# Patient Record
Sex: Female | Born: 1965
Health system: Southern US, Community
[De-identification: ages and names within clinical notes are randomized; demographics above are authoritative.]

## PROBLEM LIST (undated history)

## (undated) DIAGNOSIS — M779 Enthesopathy, unspecified: Secondary | ICD-10-CM

## (undated) DIAGNOSIS — R079 Chest pain, unspecified: Secondary | ICD-10-CM

## (undated) DIAGNOSIS — M1990 Unspecified osteoarthritis, unspecified site: Secondary | ICD-10-CM

## (undated) DIAGNOSIS — F329 Major depressive disorder, single episode, unspecified: Secondary | ICD-10-CM

## (undated) DIAGNOSIS — I10 Essential (primary) hypertension: Secondary | ICD-10-CM

## (undated) DIAGNOSIS — N83209 Unspecified ovarian cyst, unspecified side: Secondary | ICD-10-CM

## (undated) DIAGNOSIS — T7840XA Allergy, unspecified, initial encounter: Secondary | ICD-10-CM

## (undated) DIAGNOSIS — E785 Hyperlipidemia, unspecified: Secondary | ICD-10-CM

## (undated) DIAGNOSIS — E119 Type 2 diabetes mellitus without complications: Secondary | ICD-10-CM

## (undated) DIAGNOSIS — L600 Ingrowing nail: Secondary | ICD-10-CM

## (undated) DIAGNOSIS — F419 Anxiety disorder, unspecified: Secondary | ICD-10-CM

## (undated) DIAGNOSIS — N809 Endometriosis, unspecified: Secondary | ICD-10-CM

## (undated) DIAGNOSIS — G43909 Migraine, unspecified, not intractable, without status migrainosus: Secondary | ICD-10-CM

## (undated) DIAGNOSIS — K219 Gastro-esophageal reflux disease without esophagitis: Secondary | ICD-10-CM

## (undated) HISTORY — DX: Chest pain, unspecified: R07.9

## (undated) HISTORY — DX: Major depressive disorder, single episode, unspecified: F32.9

## (undated) HISTORY — DX: Unspecified osteoarthritis, unspecified site: M19.90

## (undated) HISTORY — PX: BREAST BIOPSY: SHX20

## (undated) HISTORY — DX: Endometriosis, unspecified: N80.9

## (undated) HISTORY — DX: Unspecified ovarian cyst, unspecified side: N83.209

## (undated) HISTORY — PX: EYE SURGERY: SHX253

## (undated) HISTORY — PX: GALLBLADDER SURGERY: SHX652

## (undated) HISTORY — DX: Essential (primary) hypertension: I10

## (undated) HISTORY — DX: Migraine, unspecified, not intractable, without status migrainosus: G43.909

## (undated) HISTORY — PX: TUBAL LIGATION: SHX77

## (undated) HISTORY — DX: Hyperlipidemia, unspecified: E78.5

## (undated) HISTORY — DX: Allergy, unspecified, initial encounter: T78.40XA

## (undated) HISTORY — DX: Type 2 diabetes mellitus without complications: E11.9

## (undated) HISTORY — DX: Gastro-esophageal reflux disease without esophagitis: K21.9

## (undated) HISTORY — DX: Anxiety disorder, unspecified: F41.9

## (undated) HISTORY — DX: Enthesopathy, unspecified: M77.9

## (undated) HISTORY — DX: Ingrowing nail: L60.0

---

## 2014-09-23 NOTE — Progress Notes (Signed)
Patient ID: Terry Thompson, female   DOB: 09/26/1965, 49 y.o.   MRN: [id number]     Cardiology Office Note   Date:  09/24/2014   ID:  Terry Thompson, DOB 08/19/1965, MRN [id number]  PCP:  [facility]   Cardiologist:   Charlton HawsPeter Jermario Kalmar, MD   No chief complaint on file.     History of Present Illness: Terry Thompson is a 49 y.o. female who presents for evaluation of abnormal ECG  Had routine physical with Terry Thompson on 08/11/14  Apparently had atypical chest pain and evaluated at [facility].  R/O negative troponins ECG with non specific ST changes.  CXR with bronchitis given antibiotics Also had GERD like pain Rx with nexium with ocassional nausea CRF include type 2 DM, HTN and elevated lipids on Rx GB removed in 1990 No recent pain.  She has significant clinical COPD/Bronchitis.  She has not taken any of her meds last 3 days due to finances and was very HTN/Tachycardic on arrival to our office   Lab review 05/07/14  K 4.3  Cr .51 normal LFTls H pylori negative LDL 161 TC 243  A1c 7.8     Past Medical History  Diagnosis Date  . Diabetes mellitus without complication   . GERD (gastroesophageal reflux disease)   . Hyperlipidemia   . Hypertension   . Migraine   . Ingrowing toenail   . Chest pain   . Arthritis     lumbar  . Endometriosis   . Major depressive disorder   . Ovarian cyst   . Tendinitis     wrist    Past Surgical History  Procedure Laterality Date  . Breast biopsy    . Eye surgery    . Gallbladder surgery    . Tubal ligation       Current Outpatient Prescriptions  Medication Sig Dispense Refill  . butalbital-acetaminophen-caffeine (FIORICET WITH CODEINE) 50-325-40-30 MG per capsule Take 1 capsule by mouth every 4 (four) hours as needed for headache.    . Calcium Carbonate-Vitamin D 600-400 MG-UNIT per tablet Take 1 tablet by mouth daily.    Marland Kitchen. esomeprazole (NEXIUM) 40 MG packet Take 40 mg by mouth daily before breakfast.    . gabapentin  (NEURONTIN) 300 MG capsule Take 300 mg by mouth 3 (three) times daily.    . hydrochlorothiazide (HYDRODIURIL) 25 MG tablet Take 25 mg by mouth daily.    Marland Kitchen. lisinopril (PRINIVIL,ZESTRIL) 10 MG tablet Take 10 mg by mouth daily.    . metFORMIN (GLUCOPHAGE) 500 MG tablet Take 500 mg by mouth 2 (two) times daily with a meal.    . pravastatin (PRAVACHOL) 80 MG tablet Take 80 mg by mouth daily.    . propranolol (INDERAL) 40 MG tablet Take 40 mg by mouth 3 (three) times daily.    . traMADol (ULTRAM) 50 MG tablet Take 50 mg by mouth every 6 (six) hours as needed.     No current facility-administered medications for this visit.    Allergies:   Naproxen    Social History:  The patient  reports that she has been smoking.  She does not have any smokeless tobacco history on file.   Family History:  The patient's family history includes Diabetes in her brother; Heart disease in her brother and father.    ROS:  Please see the history of present illness.   Otherwise, review of systems are positive for none.   All other systems are reviewed and negative.  PHYSICAL EXAM: VS:  BP 180/120 mmHg  Pulse 115  Ht  (1.778 m)  Wt 213 lb (96.616 kg)  BMI 30.56 kg/m2 , BMI Body mass index is 30.56 kg/(m^2). Chronically ill white female  HEENT: strabismus with lazy right eye  Neck: no JVD, carotid bruits, or masses Cardiac:  RRR; no murmurs, rubs, or gallops,no edema  Respiratory:  Diffuse bronchitic changes and rhonchi with exp wheezing  GI: soft, nontender, nondistended, + BS MS: no deformity or atrophy Skin: warm and dry, no rash Neuro:  Strength and sensation are intact Psych: euthymic mood, full affect   EKG:   SR rate 77  T wave inversions in I, and AVL  05/29/14    09/24/14  SR rate 115  T wave inversions I,AVL  Tachy rate 115 not taking her inderal last 3 days    Recent Labs: No results found for requested labs within last 365 days.    Lipid Panel No results found for: CHOL, TRIG, HDL,  CHOLHDL, VLDL, LDLCALC, LDLDIRECT    Wt Readings from Last 3 Encounters:  09/24/14 213 lb (96.616 kg)      Other studies Reviewed: Additional studies/ records that were reviewed today include: Primary Care records Terry Thompson see HPI details .    ASSESSMENT AND PLAN:  1.   Abnormal ECG  Repeat in our office with persistant lateral T wave changes. Will need stress testing at some point but need to get back on meds for tachycardia and HTN first.   Will order echo to assess LVH, EF and RWMA 2. HTN  Warned her about non compliance Given dose of bystolic and tribenzor in office Called in generic scripts to [facility] for meds  F/U with me next available 3. DM  Get back on glucophage fu primary refilled script 4. COPD discussed smoking cessation Recent CXR with bronchitis  Would benefit from inhalers  F/U primary    Current medicines are reviewed at length with the patient today.  The patient does not have concerns regarding medicines.  The following changes have been made:  New scripts [facility] for HTN meds   Labs/ tests ordered today include:  Echo  No orders of the defined types were placed in this encounter.     Disposition:   FU with  Next available    i    Signed, Charlton Haws, MD  09/24/2014 11:15 AM    [facility] [address] [place], [place], [place]  [address] Phone: [phone]; Fax: [phone]

## 2014-09-24 ENCOUNTER — Encounter: Payer: Self-pay | Admitting: Cardiovascular Disease

## 2014-09-24 ENCOUNTER — Ambulatory Visit (INDEPENDENT_AMBULATORY_CARE_PROVIDER_SITE_OTHER): Payer: PRIVATE HEALTH INSURANCE | Admitting: Cardiovascular Disease

## 2014-09-24 VITALS — BP 180/120 | HR 115 | Ht 70.0 in | Wt 213.0 lb

## 2014-09-24 DIAGNOSIS — R0789 Other chest pain: Secondary | ICD-10-CM

## 2014-09-24 MED ORDER — PRAVASTATIN SODIUM 80 MG PO TABS: 80.0000 mg | ORAL_TABLET | Freq: Every day | ORAL | Status: DC

## 2014-09-24 MED ORDER — METOPROLOL TARTRATE 50 MG PO TABS: 50.0000 mg | ORAL_TABLET | Freq: Two times a day (BID) | ORAL | Status: DC

## 2014-09-24 MED ORDER — LISINOPRIL 40 MG PO TABS: 40.0000 mg | ORAL_TABLET | Freq: Every day | ORAL | Status: DC

## 2014-09-24 MED ORDER — HYDROCHLOROTHIAZIDE 25 MG PO TABS: 25.0000 mg | ORAL_TABLET | Freq: Every day | ORAL | Status: DC

## 2014-09-24 MED ORDER — METFORMIN HCL 500 MG PO TABS: 500.0000 mg | ORAL_TABLET | Freq: Two times a day (BID) | ORAL | Status: DC

## 2014-09-24 NOTE — Patient Instructions (Signed)
Your physician recommends that you schedule a follow-up appointment in: 3 WEEKS [facility]  [facility]  Your physician has recommended you make the following change in your medication:  STOP PROPANOLOL START METOPROLOL  50 MG  TWICE  DAILY   Your physician has requested that you have an echocardiogram. Echocardiography is a painless test that uses sound waves to create images of your heart. It provides your doctor with information about the size and shape of your heart and how well your heart's chambers and valves are working. This procedure takes approximately one hour. There are no restrictions for this procedure.

## 2014-10-01 ENCOUNTER — Ambulatory Visit (HOSPITAL_COMMUNITY): Payer: PRIVATE HEALTH INSURANCE | Attending: Cardiovascular Disease | Admitting: Radiology

## 2014-10-01 DIAGNOSIS — R079 Chest pain, unspecified: Secondary | ICD-10-CM

## 2014-10-01 DIAGNOSIS — R0789 Other chest pain: Secondary | ICD-10-CM

## 2014-10-01 NOTE — Progress Notes (Signed)
Echocardiogram performed.  

## 2014-10-26 NOTE — Progress Notes (Signed)
Patient ID: Terry NakayamaKaren Chelf, female   DOB: 06/17/1966, 49 y.o.   MRN: [id number]     Cardiology Office Note   Date:  10/26/2014   ID:  Terry Thompson, DOB 04/28/1966, MRN [id number]  PCP:  [facility]   Cardiologist:   Charlton HawsPeter Nishan, MD   No chief complaint on file.     History of Present Illness: Terry NakayamaKaren Sukup is a 49 y.o. female who presents for evaluation of abnormal ECG  Had routine physical with Amao on 08/11/14  Apparently had atypical chest pain and evaluated at [facility].  R/O negative troponins ECG with non specific ST changes.  CXR with bronchitis given antibiotics Also had GERD like pain Rx with nexium with ocassional nausea CRF include type 2 DM, HTN and elevated lipids on Rx GB removed in 1990 No recent pain.  She has significant clinical COPD/Bronchitis.  She has not taken any of her meds last 3 days due to finances and was very HTN/Tachycardic on arrival to our office   Restarted on meds and compliance stressed now f/u   Lab review 05/07/14  K 4.3  Cr .51 normal LFTls H pylori negative LDL 161 TC 243  A1c 7.8   Echo done 4/14 /16 normal Study Conclusions  - Left ventricle: The cavity size was normal. Systolic function was normal. The estimated ejection fraction was in the range of 55% to 60%. Wall motion was normal; there were no regional wall motion abnormalities. Left ventricular diastolic function parameters were normal. - Mitral valve: Calcified annulus. Mildly thickened leaflets . - Pulmonary arteries: Systolic pressure was mildly increased. PA peak pressure: 31 mm Hg (S).  Was hospitalized for chest pain in [place] end of April  R/O no stress testing or cath done ? GI in nature   Past Medical History  Diagnosis Date  . Diabetes mellitus without complication   . GERD (gastroesophageal reflux disease)   . Hyperlipidemia   . Hypertension   . Migraine   . Ingrowing toenail   . Chest pain   . Arthritis     lumbar  .  Endometriosis   . Major depressive disorder   . Ovarian cyst   . Tendinitis     wrist    Past Surgical History  Procedure Laterality Date  . Breast biopsy    . Eye surgery    . Gallbladder surgery    . Tubal ligation       Current Outpatient Prescriptions  Medication Sig Dispense Refill  . butalbital-acetaminophen-caffeine (FIORICET WITH CODEINE) 50-325-40-30 MG per capsule Take 1 capsule by mouth every 4 (four) hours as needed for headache.    . Calcium Carbonate-Vitamin D 600-400 MG-UNIT per tablet Take 1 tablet by mouth daily.    Marland Kitchen. esomeprazole (NEXIUM) 40 MG packet Take 40 mg by mouth daily before breakfast.    . gabapentin (NEURONTIN) 300 MG capsule Take 300 mg by mouth 3 (three) times daily.    . hydrochlorothiazide (HYDRODIURIL) 25 MG tablet Take 1 tablet (25 mg total) by mouth daily. 30 tablet 11  . lisinopril (PRINIVIL,ZESTRIL) 40 MG tablet Take 1 tablet (40 mg total) by mouth daily. 30 tablet 11  . metFORMIN (GLUCOPHAGE) 500 MG tablet Take 1 tablet (500 mg total) by mouth 2 (two) times daily with a meal. 60 tablet 11  . metoprolol (LOPRESSOR) 50 MG tablet Take 1 tablet (50 mg total) by mouth 2 (two) times daily. 60 tablet 11  . pravastatin (PRAVACHOL) 80 MG  tablet Take 1 tablet (80 mg total) by mouth daily. 30 tablet 11  . traMADol (ULTRAM) 50 MG tablet Take 50 mg by mouth every 6 (six) hours as needed.     No current facility-administered medications for this visit.    Allergies:   Naproxen    Social History:  The patient  reports that she has been smoking.  She does not have any smokeless tobacco history on file.   Family History:  The patient's family history includes Diabetes in her brother; Heart disease in her brother and father.    ROS:  Please see the history of present illness.   Otherwise, review of systems are positive for none.   All other systems are reviewed and negative.    PHYSICAL EXAM: VS:  There were no vitals taken for this visit. , BMI There  is no weight on file to calculate BMI. Chronically ill white female  HEENT: strabismus with lazy right eye  Neck: no JVD, carotid bruits, or masses Cardiac:  RRR; no murmurs, rubs, or gallops,no edema  Respiratory:  Diffuse bronchitic changes and rhonchi with exp wheezing  GI: soft, nontender, nondistended, + BS MS: no deformity or atrophy Skin: warm and dry, no rash Neuro:  Strength and sensation are intact Psych: euthymic mood, full affect   EKG:   SR rate 77  T wave inversions in I, and AVL  05/29/14    09/24/14  SR rate 115  T wave inversions I,AVL  Tachy rate 115 not taking her inderal last 3 days    Recent Labs: No results found for requested labs within last 365 days.    Lipid Panel No results found for: CHOL, TRIG, HDL, CHOLHDL, VLDL, LDLCALC, LDLDIRECT    Wt Readings from Last 3 Encounters:  09/24/14 213 lb (96.616 kg)      Other studies Reviewed: Additional studies/ records that were reviewed today include: Primary Care records Amao see HPI details .    ASSESSMENT AND PLAN:  1.   Chest Pain:  Discussed cath vs myovue She prefers starting with myovue.  Unable to walk on treadmill and baseline abnormal ECG  Lexiscan Myovue ordered  2. HTN  Improved compliant with generic meds now  3. DM  Get back on glucophage fu primary refilled script 4. COPD discussed smoking cessation Recent CXR with bronchitis  Would benefit from inhalers  F/U primary  She has cut back to less than 1/2 ppd since last visit   Current medicines are reviewed at length with the patient today.  The patient does not have concerns regarding medicines.  The following changes have been made:  None   Labs/ tests ordered today include:  Lexiscan myovue   No orders of the defined types were placed in this encounter.     Disposition:   FU with  Me 3 months if myovue normal     Signed, Charlton HawsPeter Nishan, MD  10/26/2014 8:38 AM    [facility] [address], [place], [place]   [address] Phone: [phone]; Fax: [phone]

## 2014-10-28 ENCOUNTER — Ambulatory Visit (INDEPENDENT_AMBULATORY_CARE_PROVIDER_SITE_OTHER): Payer: PRIVATE HEALTH INSURANCE | Admitting: Cardiovascular Disease

## 2014-10-28 ENCOUNTER — Encounter: Payer: Self-pay | Admitting: Cardiovascular Disease

## 2014-10-28 VITALS — BP 130/70 | HR 80 | Ht 70.0 in | Wt 209.8 lb

## 2014-10-28 DIAGNOSIS — R0789 Other chest pain: Secondary | ICD-10-CM

## 2014-10-28 NOTE — Patient Instructions (Signed)
Medication Instructions:  NO CHANGES  Labwork: NONE  Testing/Procedures: Your physician has requested that you have a lexiscan myoview. For further information please visit [email or web address]. Please follow instruction sheet, as given.   Follow-Up: Your physician recommends that you schedule a follow-up appointment in:  3 MONTHS  WITH DR Eden EmmsNISHAN  Any Other Special Instructions Will Be Listed Below (If Applicable). \

## 2014-11-09 ENCOUNTER — Telehealth (HOSPITAL_COMMUNITY): Payer: Self-pay

## 2014-11-09 NOTE — Telephone Encounter (Signed)
Patient given detailed instructions per Myocardial Perfusion Study Information Sheet for test on 11-10-2014 at 7:30am. Patient verbalized understanding. Randa EvensEdwards, Chipper Koudelka A

## 2014-11-10 ENCOUNTER — Ambulatory Visit (HOSPITAL_COMMUNITY): Payer: PRIVATE HEALTH INSURANCE | Attending: Cardiovascular Disease

## 2014-11-10 DIAGNOSIS — R9431 Abnormal electrocardiogram [ECG] [EKG]: Secondary | ICD-10-CM | POA: Insufficient documentation

## 2014-11-10 DIAGNOSIS — E119 Type 2 diabetes mellitus without complications: Secondary | ICD-10-CM | POA: Insufficient documentation

## 2014-11-10 DIAGNOSIS — R079 Chest pain, unspecified: Secondary | ICD-10-CM

## 2014-11-10 DIAGNOSIS — I10 Essential (primary) hypertension: Secondary | ICD-10-CM | POA: Insufficient documentation

## 2014-11-10 DIAGNOSIS — R0609 Other forms of dyspnea: Secondary | ICD-10-CM | POA: Insufficient documentation

## 2014-11-10 DIAGNOSIS — R0789 Other chest pain: Secondary | ICD-10-CM

## 2014-11-10 LAB — MYOCARDIAL PERFUSION IMAGING
CHL CUP NUCLEAR SDS: 1
CHL CUP NUCLEAR SRS: 0
CHL CUP RESTING HR STRESS: 80 {beats}/min
CHL CUP STRESS STAGE 1 HR: 82 {beats}/min
CHL CUP STRESS STAGE 1 SBP: 142 mmHg
CHL CUP STRESS STAGE 2 HR: 82 {beats}/min
CHL CUP STRESS STAGE 2 SPEED: 0 mph
CHL CUP STRESS STAGE 3 DBP: 80 mmHg
CHL CUP STRESS STAGE 3 GRADE: 0 %
CHL CUP STRESS STAGE 3 SPEED: 0 mph
CHL CUP STRESS STAGE 4 DBP: 79 mmHg
CHL CUP STRESS STAGE 4 HR: 116 {beats}/min
CHL CUP STRESS STAGE 5 DBP: 84 mmHg
CHL CUP STRESS STAGE 5 HR: 113 {beats}/min
CHL CUP STRESS STAGE 5 SBP: 152 mmHg
CHL CUP STRESS STAGE 6 SBP: 145 mmHg
CHL CUP STRESS STAGE 6 SPEED: 0 mph
CSEPEW: 1 METS
LV dias vol: 70 mL
LVSYSVOL: 23 mL
Nuc Stress EF: 66 %
Peak BP: 157 mmHg
Peak HR: 116 {beats}/min
Percent of predicted max HR: 68 %
RATE: 0.32
SSS: 1
Stage 1 DBP: 77 mmHg
Stage 1 Grade: 0 %
Stage 1 Speed: 0 mph
Stage 2 Grade: 0 %
Stage 3 HR: 99 {beats}/min
Stage 3 SBP: 136 mmHg
Stage 4 Grade: 0 %
Stage 4 SBP: 157 mmHg
Stage 4 Speed: 0 mph
Stage 5 Grade: 0 %
Stage 5 Speed: 0 mph
Stage 6 DBP: 78 mmHg
Stage 6 Grade: 0 %
Stage 6 HR: 99 {beats}/min
TID: 0.93

## 2014-11-10 MED ORDER — REGADENOSON 0.4 MG/5ML IV SOLN
0.4000 mg | Freq: Once | INTRAVENOUS | Status: AC
  Administered 2014-11-10: 0.4 mg via INTRAVENOUS

## 2014-11-10 MED ORDER — TECHNETIUM TC 99M SESTAMIBI GENERIC - CARDIOLITE
11.0000 | Freq: Once | INTRAVENOUS | Status: AC | PRN
  Administered 2014-11-10: 11 via INTRAVENOUS

## 2014-11-10 MED ORDER — TECHNETIUM TC 99M SESTAMIBI GENERIC - CARDIOLITE
33.0000 | Freq: Once | INTRAVENOUS | Status: AC | PRN
  Administered 2014-11-10: 33 via INTRAVENOUS

## 2014-12-03 ENCOUNTER — Other Ambulatory Visit: Payer: Self-pay | Admitting: Family Medicine

## 2014-12-08 ENCOUNTER — Encounter: Payer: Self-pay | Admitting: Cardiovascular Disease

## 2014-12-10 NOTE — Addendum Note (Signed)
Addended by: Drue Dun on: 12/10/2014 03:08 PM   Modules accepted: Orders

## 2015-02-01 ENCOUNTER — Ambulatory Visit: Payer: PRIVATE HEALTH INSURANCE | Admitting: Cardiovascular Disease

## 2015-02-16 ENCOUNTER — Ambulatory Visit: Payer: PRIVATE HEALTH INSURANCE | Admitting: Cardiovascular Disease

## 2015-03-25 ENCOUNTER — Telehealth: Payer: Self-pay | Admitting: Cardiovascular Disease

## 2015-03-25 NOTE — Telephone Encounter (Signed)
Should at least take ACE and beta blocker  If not feeling well go to ER or see primary Amao

## 2015-03-25 NOTE — Telephone Encounter (Signed)
UNABLE TO LEAVE MESSAGE MAIL BOX  NOT SET UP  WILL TRY TOM .Zack Seal

## 2015-03-25 NOTE — Telephone Encounter (Signed)
New message      Pt c/o BP issue: STAT if pt c/o blurred vision, one-sided weakness or slurred speech  1. What are your last 5 BP readings? Yesterday 90-54, today 108-63 2. Are you having any other symptoms (ex. Dizziness, headache, blurred vision, passed out)? Lightheaded, dizzy, blurred vision, neck pain  3. What is your BP issue? bp is too low

## 2015-03-25 NOTE — Telephone Encounter (Signed)
SPOKE WITH  PT   THE  LAST  2 DAYS  HAS  ONLY  BEEN TAKING  METOPROLOL  50 MG  BID  RAN OUT OF   LISINOPRIL  AND  HCTZ  CALLED  WITH  COMPLAINTS OF  LOW  B/P SEE READINGS  BELOW   ENCOURAGED PT TO TAKE  MEDS  AS  INSTRUCTED  AND TO ALSO    CONTINUE TO MONITOR  B/P AND  CALL WITH  READINGS ON   MON  WILL FORWARD TO DR Eden Emms   FOR  REVIEW .Terry Thompson

## 2015-03-26 NOTE — Telephone Encounter (Signed)
PT  NOTIFIED ./CY 

## 2015-03-26 NOTE — Telephone Encounter (Signed)
Follow up ° ° ° ° °Returning a nurses call °

## 2015-04-24 NOTE — Progress Notes (Signed)
Patient ID: Terry Thompson, female   DOB: 07/11/1965, 49 y.o.   MRN: [id number]     Cardiology Office Note   Date:  04/26/2015   ID:  Terry NakayamaKaren Thompson, DOB 08/18/1965, MRN [id number]  PCP:  [facility]   Cardiologist:   Terry HawsPeter Creola Krotz, MD   No chief complaint on file.     History of Present Illness: Terry Thompson is a 49 y.o. female who presents for evaluation of abnormal ECG  Had routine physical with Terry Thompson on 08/11/14  Apparently had atypical chest pain and evaluated at [facility].  R/O negative troponins ECG with non specific ST changes.  CXR with bronchitis given antibiotics Also had GERD like pain Rx with nexium with ocassional nausea CRF include type 2 DM, HTN and elevated lipids on Rx GB removed in 1990 No recent pain.  She has significant clinical COPD/Bronchitis.  She has not taken any of her meds last 3 days due to finances and was very HTN/Tachycardic on arrival to our office   Restarted on meds and compliance stressed now f/u   Lab review 05/07/14  K 4.3  Cr .51 normal LFTls H pylori negative LDL 161 TC 243  A1c 7.8   Echo done 4/14 /16 normal Study Conclusions  - Left ventricle: The cavity size was normal. Systolic function was normal. The estimated ejection fraction was in the range of 55% to 60%. Wall motion was normal; there were no regional wall motion abnormalities. Left ventricular diastolic function parameters were normal. - Mitral valve: Calcified annulus. Mildly thickened leaflets . - Pulmonary arteries: Systolic pressure was mildly increased. PA peak pressure: 31 mm Hg (S).  Was hospitalized for chest pain in [place] end of April  R/O no stress testing or cath done ? GI in nature  F/U myovue here in May was normal with no ischemia   Past Medical History  Diagnosis Date  . Diabetes mellitus without complication (HCC)   . GERD (gastroesophageal reflux disease)   . Hyperlipidemia   . Hypertension   . Migraine   . Ingrowing  toenail   . Chest pain   . Arthritis     lumbar  . Endometriosis   . Major depressive disorder (HCC)   . Ovarian cyst   . Tendinitis     wrist    Past Surgical History  Procedure Laterality Date  . Breast biopsy    . Eye surgery    . Gallbladder surgery    . Tubal ligation       Current Outpatient Prescriptions  Medication Sig Dispense Refill  . butalbital-acetaminophen-caffeine (FIORICET WITH CODEINE) 50-325-40-30 MG per capsule Take 1 capsule by mouth every 4 (four) hours as needed for headache.    . Calcium Carbonate-Vitamin D 600-400 MG-UNIT per tablet Take 1 tablet by mouth daily.    Marland Kitchen. esomeprazole (NEXIUM) 40 MG packet Take 40 mg by mouth daily before breakfast.    . gabapentin (NEURONTIN) 300 MG capsule Take 300 mg by mouth 3 (three) times daily.    . hydrochlorothiazide (HYDRODIURIL) 25 MG tablet Take 1 tablet (25 mg total) by mouth daily. 30 tablet 11  . lisinopril (PRINIVIL,ZESTRIL) 40 MG tablet Take 1 tablet (40 mg total) by mouth daily. 30 tablet 11  . metFORMIN (GLUCOPHAGE) 500 MG tablet Take 1 tablet (500 mg total) by mouth 2 (two) times daily with a meal. 60 tablet 11  . metoprolol (LOPRESSOR) 50 MG tablet Take 1 tablet (50 mg total) by mouth  2 (two) times daily. 60 tablet 11  . pravastatin (PRAVACHOL) 80 MG tablet Take 1 tablet (80 mg total) by mouth daily. 30 tablet 11  . sertraline (ZOLOFT) 100 MG tablet Take 100 mg by mouth daily. Pt takes 1 1/2 tablets by mouth daily    . traMADol (ULTRAM) 50 MG tablet Take 50 mg by mouth every 6 (six) hours as needed.     No current facility-administered medications for this visit.    Allergies:   Naproxen    Social History:  The patient  reports that she has been smoking.  She does not have any smokeless tobacco history on file.   Family History:  The patient's family history includes Diabetes in her brother; Heart disease in her brother and father.    ROS:  Please see the history of present illness.   Otherwise,  review of systems are positive for none.   All other systems are reviewed and negative.    PHYSICAL EXAM: VS:  BP 118/62 mmHg  Pulse 72  Ht  (1.778 m)  Wt 92.534 kg (204 lb)  BMI 29.27 kg/m2 , BMI Body mass index is 29.27 kg/(m^2). Chronically ill white female  HEENT: strabismus with lazy right eye  Neck: no JVD, carotid bruits, or masses Cardiac:  RRR; no murmurs, rubs, or gallops,no edema  Respiratory:  Diffuse bronchitic changes and rhonchi with exp wheezing  GI: soft, nontender, nondistended, + BS MS: no deformity or atrophy Skin: warm and dry, no rash Neuro:  Strength and sensation are intact Psych: euthymic mood, full affect   EKG:   SR rate 77  T wave inversions in I, and AVL  05/29/14    09/24/14  SR rate 115  T wave inversions I,AVL  Tachy rate 115 not taking her inderal last 3 days    Recent Labs: No results found for requested labs within last 365 days.    Lipid Panel No results found for: CHOL, TRIG, HDL, CHOLHDL, VLDL, LDLCALC, LDLDIRECT    Wt Readings from Last 3 Encounters:  04/26/15 92.534 kg (204 lb)  11/10/14 94.348 kg (208 lb)  10/28/14 95.165 kg (209 lb 12.8 oz)      Other studies Reviewed: Additional studies/ records that were reviewed today include: Primary Care records Terry Thompson see HPI details .    ASSESSMENT AND PLAN:  1.   Chest Pain:  Resolved normal myovue in May observe 2. HTN  Improved compliant with generic meds now  3. DM  Get back on glucophage fu primary refilled script 4. COPD discussed smoking cessation Recent CXR with bronchitis  Would benefit from inhalers  F/U primary  She has cut back to less than 1/2 ppd since last visit  Biggest issue is getting primary care and DM f/u Will refer to [facility] family practice and Terry Thompson Apparently her combined income too much for [employer]  Current medicines are reviewed at length with the patient today.  The patient does not have concerns regarding medicines.  The following changes  have been made:  None   Labs/ tests ordered today include:  Lexiscan myovue   No orders of the defined types were placed in this encounter.     Disposition:   FU with  Me 3 months if myovue normal     Signed, Terry Haws, MD  04/26/2015 8:39 AM    [facility] [address] [place], [place], [place]  [address] Phone: [phone]; Fax: [phone]

## 2015-04-26 ENCOUNTER — Encounter: Payer: Self-pay | Admitting: Internal Medicine

## 2015-04-26 ENCOUNTER — Ambulatory Visit (INDEPENDENT_AMBULATORY_CARE_PROVIDER_SITE_OTHER): Payer: Self-pay | Admitting: Cardiovascular Disease

## 2015-04-26 ENCOUNTER — Encounter: Payer: Self-pay | Admitting: Cardiovascular Disease

## 2015-04-26 VITALS — BP 118/62 | HR 72 | Ht 70.0 in | Wt 204.0 lb

## 2015-04-26 DIAGNOSIS — E139 Other specified diabetes mellitus without complications: Secondary | ICD-10-CM

## 2015-04-26 MED ORDER — TRAMADOL HCL 50 MG PO TABS
50.0000 mg | ORAL_TABLET | Freq: Four times a day (QID) | ORAL | Status: AC | PRN
Start: 2015-04-26 — End: ?

## 2015-04-26 MED ORDER — ESOMEPRAZOLE MAGNESIUM 40 MG PO PACK: 40.0000 mg | PACK | Freq: Every day | ORAL | Status: DC

## 2015-04-26 NOTE — Patient Instructions (Signed)
Medication Instructions:  Your physician recommends that you continue on your current medications as directed. Please refer to the Current Medication list given to you today.   Labwork: NONE  Testing/Procedures: NONE  Follow-Up: Your physician wants you to follow-up in: 6 MONTHS WITH DR  Haywood FillerNISHAN  You will receive a reminder letter in the mail two months in advance. If you don't receive a letter, please call our office to schedule the follow-up appointment.  You have been referred to [facility] DR Lafe GarinGHERGE   ENDO  FOR  DIABETES  Any Other Special Instructions Will Be Listed Below (If Applicable).     If you need a refill on your cardiac medications before your next appointment, please call your pharmacy.

## 2015-04-29 ENCOUNTER — Other Ambulatory Visit: Payer: Self-pay | Admitting: Cardiovascular Disease

## 2015-04-29 ENCOUNTER — Telehealth: Payer: Self-pay | Admitting: Cardiovascular Disease

## 2015-04-29 MED ORDER — METOPROLOL TARTRATE 50 MG PO TABS: 50.0000 mg | ORAL_TABLET | Freq: Two times a day (BID) | ORAL | Status: DC

## 2015-04-29 MED ORDER — ESOMEPRAZOLE MAGNESIUM 40 MG PO PACK: 40.0000 mg | PACK | Freq: Every day | ORAL | Status: DC

## 2015-04-29 MED ORDER — LISINOPRIL 40 MG PO TABS: 40.0000 mg | ORAL_TABLET | Freq: Every day | ORAL | Status: DC

## 2015-04-29 MED ORDER — PRAVASTATIN SODIUM 80 MG PO TABS: 80.0000 mg | ORAL_TABLET | Freq: Every day | ORAL | Status: AC

## 2015-04-29 MED ORDER — HYDROCHLOROTHIAZIDE 25 MG PO TABS: 25.0000 mg | ORAL_TABLET | Freq: Every day | ORAL | Status: DC

## 2015-04-29 NOTE — Telephone Encounter (Signed)
°*  STAT* If patient is at the pharmacy, call can be transferred to refill team.   1. Which medications need to be refilled? (please list name of each medication and dose if known) Nexium 40mg    2. Which pharmacy/location (including street and city if local pharmacy) is medication to be sent to [facility] [address] [place] [id number] [phone] Ext [phone]  3. Do they need a 30 day or 90 day supply? 90  Comments: requesting varification the orig script says packet and they are not sure if it should say tablet.

## 2015-04-29 NOTE — Telephone Encounter (Signed)
Pt would like a refill on these medications. Please advise

## 2015-04-29 NOTE — Telephone Encounter (Signed)
New message      *STAT* If patient is at the pharmacy, call can be transferred to refill team.   1. Which medications need to be refilled? (please list name of each medication and dose if known) butalbital, nexium 40mg , gabapentin 300mg , HCTZ 25mg , lisinopril 40mg , metoprolol 50mg  and pravastatin 80mg  2. Which pharmacy/location (including street and city if local pharmacy) is medication to be sent to? [facility] mail order pharmacy [phone]  3. Do they need a 30 day or 90 day supply? 90 day supply These medications were called in to [facility] but pt wanted them called in to mail order pharmacy

## 2015-04-29 NOTE — Telephone Encounter (Signed)
Pt's medication [facility]ills were [facility], at pt's request. Pt gave Ph# as [phone]. Pt's nexium, HCTZ, lisinopril, metoprolol and pravastatin. Confirmation received. The butalbital and gabapentin was not sent in. Would you like to refill these medications. Please advise

## 2015-04-29 NOTE — Telephone Encounter (Signed)
Called pt and spoke with pt's  Husband Jillyn HiddenGary, explaining to him that the pt's medication had already been sent to her pharmacy that she requested, except for the butalbital and the gabapentin, that doctor has to sign off for these two and when the doctor response back then someone will give her a call back. I advised her husband that if the pt has any other problems, questions or concerns to call the office. Husband Jillyn HiddenGary verbalized understanding.

## 2015-05-03 MED ORDER — BUTALBITAL-APAP-CAFF-COD 50-325-40-30 MG PO CAPS: 1.0000 | ORAL_CAPSULE | ORAL | Status: DC | PRN

## 2015-05-03 MED ORDER — GABAPENTIN 300 MG PO CAPS: 300.0000 mg | ORAL_CAPSULE | Freq: Three times a day (TID) | ORAL | Status: DC

## 2015-05-03 MED ORDER — BUTALBITAL-APAP-CAFF-COD 50-325-40-30 MG PO CAPS: 1.0000 | ORAL_CAPSULE | ORAL | Status: AC | PRN

## 2015-05-03 NOTE — Telephone Encounter (Signed)
PT  NOTIFIED  SCRIPT  SENT  FOR  GABAPENTIN  AND  FLORICEF WITH CODEINE  FILLED   WITH  3  REFILLS   UNTIL  CAN  GET  ESTABLISHED  WITH PMD .Zack Seal/CY

## 2015-05-21 ENCOUNTER — Telehealth: Payer: Self-pay | Admitting: Internal Medicine

## 2015-05-21 ENCOUNTER — Ambulatory Visit (INDEPENDENT_AMBULATORY_CARE_PROVIDER_SITE_OTHER): Payer: Self-pay | Admitting: Internal Medicine

## 2015-05-21 ENCOUNTER — Telehealth: Payer: Self-pay | Admitting: *Deleted

## 2015-05-21 ENCOUNTER — Other Ambulatory Visit (INDEPENDENT_AMBULATORY_CARE_PROVIDER_SITE_OTHER): Payer: Self-pay | Admitting: *Deleted

## 2015-05-21 ENCOUNTER — Encounter: Payer: Self-pay | Admitting: Internal Medicine

## 2015-05-21 VITALS — BP 116/64 | HR 71 | Temp 97.7°F | Resp 12 | Ht 70.0 in | Wt 203.0 lb

## 2015-05-21 DIAGNOSIS — E1140 Type 2 diabetes mellitus with diabetic neuropathy, unspecified: Secondary | ICD-10-CM

## 2015-05-21 LAB — POCT GLYCOSYLATED HEMOGLOBIN (HGB A1C): HEMOGLOBIN A1C: 6.9

## 2015-05-21 NOTE — Telephone Encounter (Signed)
Call to patient after RTC to patient on 11/29 about getting information for applying for assistance from [employer] managerthe Financial Counselor.  Spoke with patient's husband who stated that patient was to get something in the mail about what she would need to bring to talk with the Artistinancial Counselor .  Spoke to D. Lynnea FerrierSolomon who said that information has been sent to patient in the mail.  Call this am was to verify if patient has received information and if not get a valid address.  Message was left on patient cell phone voice mail to call the Clinics to let us know if she has received the information.  Angelina OkGladys Dasie Chancellor, RN 05/21/2015 10:02 AM

## 2015-05-21 NOTE — Progress Notes (Signed)
Patient ID: Terry Thompson, female   DOB: 19-Dec-1965, 49 y.o.   MRN: [id number]  HPI: Terry Thompson is a 49 y.o.-year-old female, referred by Dr. Eden Emms, for management of DM2, dx in 2014, non-insulin-dependent, uncontrolled, with complications (PN). She is here with her husband who offers part of the hx.  Last hemoglobin A1c was: 2014: HbA1c 11%  This was not rechecked since then.  Pt was started on Metformin at dx, then out of it for 2-3 months. She was recently restarted 3 weeks ago: - Metformin 500 mg 2x a day, with meals  Pt does not check sugars - not in last 2 mo (could not afford strips). Before: - am: 130s - 2h after b'fast: n/c - before lunch: n/c - 2h after lunch: n/c - before dinner: n/c - 2h after dinner: n/c - bedtime: n/c - nighttime: n/c No lows. Lowest sugar was 130s; ? hypoglycemia awareness. Highest sugar was 200s  Glucometer: ReliOn  Pt's meals are: - Brunch: eggs, bacon or sausage, toast; sausage gravy - Dinner: steak + veggies; sloppy joes; chicken pie + potatoes; hamburgers - Snacks: no Used to drink a lot of [employer] (6-7 a day) >> stopped. Now [employer] Dr Frazier Richards.  She saw nutrition in the past.  - no CKD, last BUN/creatinine:  05/29/2014: 7/0.43 - last set of lipids: No results found for: CHOL, HDL, LDLCALC, LDLDIRECT, TRIG, CHOLHDL - last eye exam was in 2014. No DR.  - + numbness and tingling in her hands and feet. On Neurontin 300 mg 2x a day.   Pt has FH of DM in brother, sister.  She also has a history of HTN, HL, GERD.  ROS: Constitutional: no weight gain/loss, + fatigue, + hot flashes, + poor sleep, + nocturia  Eyes: + blurry vision, no xerophthalmia ENT: + sore throat, no nodules palpated in throat, + dysphagia/no odynophagia, no hoarseness Cardiovascular: + CP/+ SOB/no palpitations/+ leg swelling Respiratory:+ WUJ:WJXBJ/YNW/GNFAOZHY  Gastrointestinal: + all: N/V/D/C/acid reflux  Musculoskeletal: + all:  muscle/joint aches Skin:  no rashes, + hair loss Neurological: no tremors/numbness/tingling/dizziness, + headaches Psychiatric: no depression/anxiety  Past Medical History  Diagnosis Date  . Diabetes mellitus without complication (HCC)   . GERD (gastroesophageal reflux disease)   . Hyperlipidemia   . Hypertension   . Migraine   . Ingrowing toenail   . Chest pain   . Arthritis     lumbar  . Endometriosis   . Major depressive disorder (HCC)   . Ovarian cyst   . Tendinitis     wrist   Past Surgical History  Procedure Laterality Date  . Breast biopsy    . Eye surgery    . Gallbladder surgery    . Tubal ligation     Social History   Social History  . Marital Status: Married    Spouse Name: N/A  . Number of Children: 2   Occupational History  .  disabled    Social History Main Topics  . Smoking status: Current Every Day Smoker  . Smokeless tobacco: No  . Alcohol Use: N0  . Drug Use: No   Current Outpatient Prescriptions on File Prior to Visit  Medication Sig Dispense Refill  . butalbital-acetaminophen-caffeine (FIORICET WITH CODEINE) 50-325-40-30 MG capsule Take 1 capsule by mouth every 4 (four) hours as needed for headache. 30 capsule 3  . Calcium Carbonate-Vitamin D 600-400 MG-UNIT per tablet Take 1 tablet by mouth daily.    Marland Kitchen esomeprazole (NEXIUM) 40 MG packet Take 40 mg  by mouth daily before breakfast. 90 each 1  . gabapentin (NEURONTIN) 300 MG capsule Take 1 capsule (300 mg total) by mouth 3 (three) times daily. 90 capsule 3  . hydrochlorothiazide (HYDRODIURIL) 25 MG tablet Take 1 tablet (25 mg total) by mouth daily. 90 tablet 1  . lisinopril (PRINIVIL,ZESTRIL) 40 MG tablet Take 1 tablet (40 mg total) by mouth daily. 90 tablet 1  . metFORMIN (GLUCOPHAGE) 500 MG tablet Take 1 tablet (500 mg total) by mouth 2 (two) times daily with a meal. 60 tablet 11  . metoprolol (LOPRESSOR) 50 MG tablet Take 1 tablet (50 mg total) by mouth 2 (two) times daily. 180 tablet 1  . pravastatin (PRAVACHOL) 80  MG tablet Take 1 tablet (80 mg total) by mouth daily. 90 tablet 1  . sertraline (ZOLOFT) 100 MG tablet Take 100 mg by mouth daily. Pt takes 1 1/2 tablets by mouth daily    . traMADol (ULTRAM) 50 MG tablet Take 1 tablet (50 mg total) by mouth every 6 (six) hours as needed. 120 tablet 5   No current facility-administered medications on file prior to visit.   Allergies  Allergen Reactions  . Naproxen Other (See Comments)    unkown   Family History  Problem Relation Age of Onset  . Heart disease Father   . Heart disease Brother   . Diabetes Brother    PE: BP 116/64 mmHg  Pulse 71  Temp(Src) 97.7 F (36.5 C) (Oral)  Resp 12  Ht  (1.778 m)  Wt 203 lb (92.08 kg)  BMI 29.13 kg/m2  SpO2 95% Wt Readings from Last 3 Encounters:  05/21/15 203 lb (92.08 kg)  04/26/15 204 lb (92.534 kg)  11/10/14 208 lb (94.348 kg)   Constitutional: overweight, in NAD, walks with a cane Eyes: PERRLA, EOMI, no exophthalmos ENT: moist mucous membranes, no thyromegaly, no cervical lymphadenopathy Cardiovascular: RRR, No MRG Respiratory: CTA B Gastrointestinal: abdomen soft, NT, ND, BS+ Musculoskeletal: no deformities, strength intact in all 4 Skin: moist, warm, no rashes Neurological: no tremor with outstretched hands, DTR normal in all 4  ASSESSMENT: 1. DM2, non-insulin-dependent, controlled, with complications - PN  2. Diabetic peripheral neuropathy  PLAN:  1. Patient with 2 years h/o diabetes, with unknown control after dx, on oral antidiabetic regimen with Metformin. She has a poor financial situation, and was out of metformin for few months because she did not have money to refill the prescription. She also could not afford strips in the 2 months. She has now restarted metformin approximately 3 weeks ago, but I am not sure how her sugars are doing. We checked a hemoglobin A1c today and this is very good at 6.9%. Therefore, I will continue her metformin at the current dose of 500 mg twice a  day and strongly advised her to try to check her sugars every day or every other day, rotating check times. - I also tried my best to convince her to cut out the soda from her diet. She did a good job stopping [employer], which I think help her diabetes tremendously, but she is still drinking some regular soda now. - I suggested to:  Patient Instructions  Please move Gabapentin all at night (600 mg).  Please continue Metformin 500 mg 2x a day with meals.  Please let me know if the sugars are consistently <80 or >180.  Please return in 3 months with your sugar log.   - Strongly advised her to start checking sugars at different  times of the day - check 1 times a day, rotating checks - given sugar log and advised how to fill it and to bring it at next appt  - given foot care handout and explained the principles  - given instructions for hypoglycemia management "15-15 rule"  - advised for yearly eye exams >> she needs to schedule a new one! - Return to clinic in 3 mo with sugar log   2. Diabetic peripheral neuropathy - She is on gabapentin 300 mg 2x a day, but her symptoms are most bothersome at night. I advised her to move the entire dose at night

## 2015-05-21 NOTE — Telephone Encounter (Signed)
Noted  

## 2015-05-21 NOTE — Telephone Encounter (Signed)
Phone # [phone] [facility]

## 2015-05-21 NOTE — Patient Instructions (Signed)
Please move Gabapentin all at night (600 mg).  Please continue Metformin 500 mg 2x a day with meals.  Please let me know if the sugars are consistently <80 or >180.  Please return in 3 months with your sugar log.   PATIENT INSTRUCTIONS FOR TYPE 2 DIABETES:  **Please join MyChart!** - see attached instructions about how to join if you have not done so already.  DIET AND EXERCISE Diet and exercise is an important part of diabetic treatment.  We recommended aerobic exercise in the form of brisk walking (working between 40-60% of maximal aerobic capacity, similar to brisk walking) for 150 minutes per week (such as 30 minutes five days per week) along with 3 times per week performing 'resistance' training (using various gauge rubber tubes with handles) 5-10 exercises involving the major muscle groups (upper body, lower body and core) performing 10-15 repetitions (or near fatigue) each exercise. Start at half the above goal but build slowly to reach the above goals. If limited by weight, joint pain, or disability, we recommend daily walking in a swimming pool with water up to waist to reduce pressure from joints while allow for adequate exercise.    BLOOD GLUCOSES Monitoring your blood glucoses is important for continued management of your diabetes. Please check your blood glucoses 2-4 times a day: fasting, before meals and at bedtime (you can rotate these measurements - e.g. one day check before the 3 meals, the next day check before 2 of the meals and before bedtime, etc.).   HYPOGLYCEMIA (low blood sugar) Hypoglycemia is usually a reaction to not eating, exercising, or taking too much insulin/ other diabetes drugs.  Symptoms include tremors, sweating, hunger, confusion, headache, etc. Treat IMMEDIATELY with 15 grams of Carbs: . 4 glucose tablets .  cup regular juice/soda . 2 tablespoons raisins . 4 teaspoons sugar . 1 tablespoon honey Recheck blood glucose in 15 mins and repeat above if  still symptomatic/blood glucose <100.  RECOMMENDATIONS TO REDUCE YOUR RISK OF DIABETIC COMPLICATIONS: * Take your prescribed MEDICATION(S) * Follow a DIABETIC diet: Complex carbs, fiber rich foods, (monounsaturated and polyunsaturated) fats * AVOID saturated/trans fats, high fat foods, >2,300 mg salt per day. * EXERCISE at least 5 times a week for 30 minutes or preferably daily.  * DO NOT SMOKE OR DRINK more than 1 drink a day. * Check your FEET every day. Do not wear tightfitting shoes. Contact us if you develop an ulcer * See your EYE doctor once a year or more if needed * Get a FLU shot once a year * Get a PNEUMONIA vaccine once before and once after age 51 years  GOALS:  * Your Hemoglobin A1c of <7%  * fasting sugars need to be <130 * after meals sugars need to be <180 (2h after you start eating) * Your Systolic BP should be 140 or lower  * Your Diastolic BP should be 80 or lower  * Your HDL (Good Cholesterol) should be 40 or higher  * Your LDL (Bad Cholesterol) should be 100 or lower. * Your Triglycerides should be 150 or lower  * Your Urine microalbumin (kidney function) should be <30 * Your Body Mass Index should be 25 or lower

## 2015-06-22 ENCOUNTER — Telehealth: Payer: Self-pay

## 2015-06-23 ENCOUNTER — Encounter: Payer: Self-pay | Admitting: Medical

## 2015-06-23 ENCOUNTER — Ambulatory Visit (INDEPENDENT_AMBULATORY_CARE_PROVIDER_SITE_OTHER): Payer: Self-pay | Admitting: Medical

## 2015-06-23 VITALS — BP 126/80 | HR 78 | Temp 97.4°F | Ht 69.75 in | Wt 205.4 lb

## 2015-06-23 DIAGNOSIS — E785 Hyperlipidemia, unspecified: Secondary | ICD-10-CM

## 2015-06-23 DIAGNOSIS — M1990 Unspecified osteoarthritis, unspecified site: Secondary | ICD-10-CM

## 2015-06-23 DIAGNOSIS — J4520 Mild intermittent asthma, uncomplicated: Secondary | ICD-10-CM

## 2015-06-23 DIAGNOSIS — I10 Essential (primary) hypertension: Secondary | ICD-10-CM

## 2015-06-23 DIAGNOSIS — E1140 Type 2 diabetes mellitus with diabetic neuropathy, unspecified: Secondary | ICD-10-CM

## 2015-06-23 DIAGNOSIS — E119 Type 2 diabetes mellitus without complications: Secondary | ICD-10-CM

## 2015-06-23 DIAGNOSIS — R300 Dysuria: Secondary | ICD-10-CM

## 2015-06-23 DIAGNOSIS — J309 Allergic rhinitis, unspecified: Secondary | ICD-10-CM

## 2015-06-23 DIAGNOSIS — E1149 Type 2 diabetes mellitus with other diabetic neurological complication: Secondary | ICD-10-CM

## 2015-06-23 DIAGNOSIS — J45909 Unspecified asthma, uncomplicated: Secondary | ICD-10-CM | POA: Insufficient documentation

## 2015-06-23 DIAGNOSIS — F329 Major depressive disorder, single episode, unspecified: Secondary | ICD-10-CM

## 2015-06-23 DIAGNOSIS — F32A Depression, unspecified: Secondary | ICD-10-CM | POA: Insufficient documentation

## 2015-06-23 DIAGNOSIS — K219 Gastro-esophageal reflux disease without esophagitis: Secondary | ICD-10-CM

## 2015-06-23 LAB — POCT URINALYSIS DIPSTICK
BILIRUBIN UA: NEGATIVE
GLUCOSE UA: NEGATIVE
KETONES UA: NEGATIVE
Nitrite, UA: NEGATIVE
PH UA: 6.5
Protein, UA: NEGATIVE
RBC UA: NEGATIVE
SPEC GRAV UA: 1.02
Urobilinogen, UA: 2

## 2015-06-23 MED ORDER — CIPROFLOXACIN HCL 500 MG PO TABS: 500.0000 mg | ORAL_TABLET | Freq: Two times a day (BID) | ORAL | Status: DC

## 2015-06-23 MED ORDER — ALBUTEROL SULFATE HFA 108 (90 BASE) MCG/ACT IN AERS: 2.0000 | INHALATION_SPRAY | Freq: Four times a day (QID) | RESPIRATORY_TRACT | Status: DC | PRN

## 2015-06-23 NOTE — Assessment & Plan Note (Signed)
Continue gabapentin.

## 2015-06-23 NOTE — Progress Notes (Signed)
Pre visit review using our clinic review tool, if applicable. No additional management support is needed unless otherwise documented below in the visit note. 

## 2015-06-23 NOTE — Assessment & Plan Note (Signed)
By observing her walk appears to have severe rt hip pain. She may need hip replacement but she states no insurance and can't afford. Currenlty on tramadol.

## 2015-06-23 NOTE — Patient Instructions (Addendum)
This is your first visit here and your various medical problems do appear to be controlled presently.  I will refill your pro-air inhaler.  We will do a urine culture on your urine today. Since you have some symptoms and have cva area area pain. Rx cipro antibiotic.  Follow up in 3 wks to discuss health maintenance and come in fasting to get lipid panel.

## 2015-06-23 NOTE — Assessment & Plan Note (Signed)
Continue nexium 

## 2015-06-23 NOTE — Assessment & Plan Note (Signed)
fall mostly hay fever. Was on zyrtec in the past.

## 2015-06-23 NOTE — Assessment & Plan Note (Signed)
Refilled her albuterol  

## 2015-06-23 NOTE — Assessment & Plan Note (Signed)
Stable and continue on zoloft. Follow up with psychiatrist.

## 2015-06-23 NOTE — Assessment & Plan Note (Signed)
Stable and controlled. Continue ace inhibitor.

## 2015-06-23 NOTE — Assessment & Plan Note (Signed)
Continue pravastatin. On follow up get lipid panel fasting.

## 2015-06-23 NOTE — Assessment & Plan Note (Signed)
Continue metformin. Follo up with endocrinologist.

## 2015-06-23 NOTE — Progress Notes (Signed)
Subjective:    Patient ID: Terry Thompson, female    DOB: Mar 27, 1966, 50 y.o.   MRN: [id number]  HPI   I have reviewed pt PMH, PSH, FH, Social History and Surgical History.  Pt disabled mostly from her hip. No exercise. 2 cups coffee a day. 2 sodas a day, pt eats moderate healthy diet, married.  Allergies- fall mostly hay fever. Was on zyrtec in the past.  Arthritis- pt rt hip most recently painful. But some pain in wrists ankles and knees. Pt has rx of tramadol. She has a lot refills of that.    Depression- pt takes sertraline for depression. She has been on that for 8-10 months. Pt was seeing [facility] was seeing psychiatrist for that. Still seeing psychiatrist.  Pt sees therapist every 2 wks.  Gerd- Pt had this since for over 20 yrs. She is nexium 40 mg a day. Pt has seen Gi and had egd 3 times. Years ago did have ulcer. Last egd 97 or 98.  Pt has hyperlipidemia- pt is on pravachol. Pt last had that checked about one year ago. Pt last saw [facility]. She states last saw them about one year ago.   Pt has diabetes Type II. Last had a1-c one month ago was 6.9.   Htn- pt bp controlled today. Pt on lisinopril.  Diabetic neuropathy- she is on gabapentin for that.   Asthma- use proair maybe 3-4 times a month.  Pt also has rx of fiorcet for migraines. Her cardiologist did rx that but it is too expensive.  Pt has some occasional pain on urination and some suprapubic area pain. This has gone one for 2 months per husband. No fever, no chills or sweats. Then states at times feels cold. Pt states history uti about 2 years ago but not chronic.      Review of Systems  Constitutional: Negative for fever, chills, diaphoresis, activity change and fatigue.  HENT: Negative for dental problem and drooling.   Respiratory: Negative for cough, chest tightness and shortness of breath.   Cardiovascular: Negative for chest pain, palpitations and leg swelling.  Gastrointestinal:  Negative for nausea, vomiting and abdominal pain.  Musculoskeletal: Negative for neck pain and neck stiffness.       Hip pain.  Neurological: Negative for dizziness, tremors, seizures, syncope, facial asymmetry, speech difficulty, weakness, light-headedness, numbness and headaches.  Hematological: Negative for adenopathy. Does not bruise/bleed easily.  Psychiatric/Behavioral: Negative for behavioral problems, confusion and agitation. The patient is not nervous/anxious.      Past Medical History  Diagnosis Date  . Diabetes mellitus without complication (HCC)   . GERD (gastroesophageal reflux disease)   . Hyperlipidemia   . Hypertension   . Migraine   . Ingrowing toenail   . Chest pain   . Arthritis     lumbar  . Endometriosis   . Major depressive disorder (HCC)   . Ovarian cyst   . Tendinitis     wrist  . Allergy   . Anxiety     Social History   Social History  . Marital Status: Married    Spouse Name: N/A  . Number of Children: N/A  . Years of Education: N/A   Occupational History  . Not on file.   Social History Main Topics  . Smoking status: Current Every Day Smoker  . Smokeless tobacco: Not on file  . Alcohol Use: Not on file  . Drug Use: Not on file  . Sexual Activity: Not  on file   Other Topics Concern  . Not on file   Social History Narrative    Past Surgical History  Procedure Laterality Date  . Breast biopsy    . Eye surgery    . Gallbladder surgery    . Tubal ligation      Family History  Problem Relation Age of Onset  . Heart disease Father   . Heart disease Brother   . Diabetes Brother     Allergies  Allergen Reactions  . Naproxen Other (See Comments)    unkown    Current Outpatient Prescriptions on File Prior to Visit  Medication Sig Dispense Refill  . butalbital-acetaminophen-caffeine (FIORICET WITH CODEINE) 50-325-40-30 MG capsule Take 1 capsule by mouth every 4 (four) hours as needed for headache. 30 capsule 3  . Calcium  Carbonate-Vitamin D 600-400 MG-UNIT per tablet Take 1 tablet by mouth daily.    Marland Kitchen. esomeprazole (NEXIUM) 40 MG packet Take 40 mg by mouth daily before breakfast. 90 each 1  . gabapentin (NEURONTIN) 300 MG capsule Take 1 capsule (300 mg total) by mouth 3 (three) times daily. 90 capsule 3  . hydrochlorothiazide (HYDRODIURIL) 25 MG tablet Take 1 tablet (25 mg total) by mouth daily. 90 tablet 1  . lisinopril (PRINIVIL,ZESTRIL) 40 MG tablet Take 1 tablet (40 mg total) by mouth daily. 90 tablet 1  . metFORMIN (GLUCOPHAGE) 500 MG tablet Take 1 tablet (500 mg total) by mouth 2 (two) times daily with a meal. 60 tablet 11  . metoprolol (LOPRESSOR) 50 MG tablet Take 1 tablet (50 mg total) by mouth 2 (two) times daily. 180 tablet 1  . pravastatin (PRAVACHOL) 80 MG tablet Take 1 tablet (80 mg total) by mouth daily. 90 tablet 1  . sertraline (ZOLOFT) 100 MG tablet Take 100 mg by mouth daily. Pt takes 1 1/2 tablets by mouth daily    . traMADol (ULTRAM) 50 MG tablet Take 1 tablet (50 mg total) by mouth every 6 (six) hours as needed. 120 tablet 5   No current facility-administered medications on file prior to visit.    BP 126/80 mmHg  Pulse 78  Temp(Src) 97.4 F (36.3 C) (Oral)  Ht 5' 9.75" (1.772 m)  Wt 205 lb 6.4 oz (93.169 kg)  BMI 29.67 kg/m2  SpO2 97%       Objective:   Physical Exam  General Mental Status- Alert. General Appearance- Not in acute distress.   Skin General: Color- Normal Color. Moisture- Normal Moisture.  Neck Carotid Arteries- Normal color. Moisture- Normal Moisture. No carotid bruits. No JVD.  Chest and Lung Exam Auscultation: Breath Sounds:-Normal.  Cardiovascular Auscultation:Rythm- Regular. Murmurs & Other Heart Sounds:Auscultation of the heart reveals- No Murmurs.  Abdomen Inspection:-Inspeection Normal. Palpation/Percussion:Note:No mass. Palpation and Percussion of the abdomen reveal- Non Tender, Non Distended + BS, no rebound or  guarding.    Neurologic Cranial Nerve exam:- CN III-XII intact(No nystagmus), symmetric smile. Strength:- 5/5 equal and symmetric strength both upper and lower extremities.   Rt hip- severe pain on rom. Also when ambulates looks to be in severe pain.  Back- some rt cva pain. No lt cva area pain.      Assessment & Plan:  This is your first visit here and your various medical problems do appear to be controlled presently.  I will refill your pro-air inhaler.  We will do a urine culture on your urine today. Since you have some symptoms and have cva area area pain. Rx cipro antibiotic.  Follow up  in 3 wks to discuss health maintenance and come in fasting to get lipid panel.

## 2015-06-24 ENCOUNTER — Telehealth: Payer: Self-pay | Admitting: *Deleted

## 2015-06-24 LAB — COMPREHENSIVE METABOLIC PANEL
ALK PHOS: 87 U/L (ref 39–117)
ALT: 9 U/L (ref 0–35)
AST: 11 U/L (ref 0–37)
Albumin: 4.2 g/dL (ref 3.5–5.2)
BILIRUBIN TOTAL: 0.3 mg/dL (ref 0.2–1.2)
BUN: 22 mg/dL (ref 6–23)
CO2: 24 mEq/L (ref 19–32)
CREATININE: 0.94 mg/dL (ref 0.40–1.20)
Calcium: 9.4 mg/dL (ref 8.4–10.5)
Chloride: 100 mEq/L (ref 96–112)
GFR: 66.55 mL/min (ref >60.00)
GLUCOSE: 95 mg/dL (ref 70–99)
Potassium: 4.3 mEq/L (ref 3.5–5.1)
SODIUM: 131 meq/L — AB (ref 135–145)
TOTAL PROTEIN: 7 g/dL (ref 6.0–8.3)

## 2015-06-24 LAB — MICROALBUMIN, URINE: Microalb, Ur: 0.7 mg/dL

## 2015-06-24 NOTE — Telephone Encounter (Signed)
Pt dropped off forms during visit yesterday. I called and verified with pt which medication this is for and she stated Terry Thompson. Forms filled out as much as possible and forwarded to [place]. JG//CMA

## 2015-06-28 NOTE — Telephone Encounter (Signed)
Attempted to reach pt and VM has not been setup. I will try again later. Paperwork has been completed and forwarded to Terry CoeJessica Glover to finish.

## 2015-06-28 NOTE — Telephone Encounter (Signed)
Filled out [employer] form/signed my portion.  Pt can come in and pick up form. Would you make copies and scan scopies.

## 2015-07-13 ENCOUNTER — Ambulatory Visit (INDEPENDENT_AMBULATORY_CARE_PROVIDER_SITE_OTHER): Payer: Self-pay | Admitting: Medical

## 2015-07-13 ENCOUNTER — Encounter: Payer: Self-pay | Admitting: Medical

## 2015-07-13 VITALS — BP 122/82 | HR 71 | Temp 98.0°F | Ht 70.0 in | Wt 205.8 lb

## 2015-07-13 DIAGNOSIS — I10 Essential (primary) hypertension: Secondary | ICD-10-CM

## 2015-07-13 DIAGNOSIS — Z23 Encounter for immunization: Secondary | ICD-10-CM

## 2015-07-13 DIAGNOSIS — Z800 Family history of malignant neoplasm of digestive organs: Secondary | ICD-10-CM

## 2015-07-13 DIAGNOSIS — E785 Hyperlipidemia, unspecified: Secondary | ICD-10-CM

## 2015-07-13 LAB — LDL CHOLESTEROL, DIRECT: Direct LDL: 81 mg/dL

## 2015-07-13 LAB — LIPID PANEL
CHOL/HDL RATIO: 5
CHOLESTEROL: 164 mg/dL (ref 0–200)
HDL: 35.4 mg/dL — ABNORMAL LOW (ref >39.00)
NONHDL: 128.34
Triglycerides: 314 mg/dL — ABNORMAL HIGH (ref 0.0–149.0)
VLDL: 62.8 mg/dL — AB (ref 0.0–40.0)

## 2015-07-13 NOTE — Progress Notes (Signed)
Pre visit review using our clinic review tool, if applicable. No additional management support is needed unless otherwise documented below in the visit note. 

## 2015-07-13 NOTE — Patient Instructions (Addendum)
Htn- well controlled. Continue same meds.  Hyperlipidemia- will get fasting lipid panel today.  Flu vaccine today and tdap.  Your are working on getting diabetic eye exam and glasses through [facility] and will get papsmear/mammo with [facility]. When those area scheduled  And done please  get copies of results so we can scan to our records.  For your history of colon cancer in family will get ifob. You express that you don't have funds/declined referral  for colonoscopy. I will refer whenever you are ready/want referral.  Follow up 2 months or as needed.(maybe sooner depending on lab results)

## 2015-07-13 NOTE — Progress Notes (Signed)
Subjective:    Patient ID: Terry Thompson, female    DOB: September 19, 1965, 50 y.o.   MRN: [id number]  HPI   Pt in for evaluation/ follow up new pt and numerous new problems documented last visit. Wanted her to get lipid panel today for htn and hyperlipidemies dx.  Also wanted to work on health maintenance since she is new pt and has a lot of maintenance issues to address.  Pt has not had diabetic eye exam. Pt trying to get the exam and  [facility] may help her with exam and glasses. She is arranging to make appointment.  Pt will get tdap today and flu vaccine today.  Regarding  pap smear and mammogram. She is going to go through [facility] program and will get mammogram which is upcoming she states has to schedule. Last mammogram one year was normal. Also last pap was reported to be normal last year.  Pt never had a colonoscopy. Pt willing to get ifcost was not a  factor.     Review of Systems  Constitutional: Negative for fever, chills, diaphoresis, activity change and fatigue.  HENT: Negative for congestion and drooling.   Respiratory: Negative for cough, chest tightness and shortness of breath.   Cardiovascular: Negative for chest pain, palpitations and leg swelling.  Gastrointestinal: Negative for nausea, vomiting, abdominal pain, diarrhea, blood in stool, anal bleeding and rectal pain.       Inttermittent episodes of constipation.  Musculoskeletal: Negative for neck pain and neck stiffness.  Neurological: Negative for dizziness, tremors, seizures, syncope, facial asymmetry, speech difficulty, weakness, light-headedness, numbness and headaches.  Psychiatric/Behavioral: Negative for behavioral problems, confusion and agitation. The patient is not nervous/anxious.     Past Medical History  Diagnosis Date  . Diabetes mellitus without complication (HCC)   . GERD (gastroesophageal reflux disease)   . Hyperlipidemia   . Hypertension   . Migraine   . Ingrowing toenail   .  Chest pain   . Arthritis     lumbar  . Endometriosis   . Major depressive disorder (HCC)   . Ovarian cyst   . Tendinitis     wrist  . Allergy   . Anxiety     Social History   Social History  . Marital Status: Married    Spouse Name: N/A  . Number of Children: N/A  . Years of Education: N/A   Occupational History  . Not on file.   Social History Main Topics  . Smoking status: Current Every Day Smoker  . Smokeless tobacco: Not on file  . Alcohol Use: Not on file     Comment: did not ask today.  . Drug Use: No  . Sexual Activity: Yes   Other Topics Concern  . Not on file   Social History Narrative    Past Surgical History  Procedure Laterality Date  . Breast biopsy    . Eye surgery    . Gallbladder surgery    . Tubal ligation      Family History  Problem Relation Age of Onset  . Heart disease Father   . Heart disease Brother   . Diabetes Brother     Allergies  Allergen Reactions  . Naproxen Other (See Comments)    unkown    Current Outpatient Prescriptions on File Prior to Visit  Medication Sig Dispense Refill  . albuterol (PROVENTIL HFA;VENTOLIN HFA) 108 (90 Base) MCG/ACT inhaler Inhale 2 puffs into the lungs every 6 (six) hours as needed  for wheezing or shortness of breath. 1 Inhaler 0  . butalbital-acetaminophen-caffeine (FIORICET WITH CODEINE) 50-325-40-30 MG capsule Take 1 capsule by mouth every 4 (four) hours as needed for headache. 30 capsule 3  . Calcium Carbonate-Vitamin D 600-400 MG-UNIT per tablet Take 1 tablet by mouth daily.    . ciprofloxacin (CIPRO) 500 MG tablet Take 1 tablet (500 mg total) by mouth 2 (two) times daily. 14 tablet 0  . esomeprazole (NEXIUM) 40 MG packet Take 40 mg by mouth daily before breakfast. 90 each 1  . gabapentin (NEURONTIN) 300 MG capsule Take 1 capsule (300 mg total) by mouth 3 (three) times daily. 90 capsule 3  . hydrochlorothiazide (HYDRODIURIL) 25 MG tablet Take 1 tablet (25 mg total) by mouth daily. 90 tablet  1  . lisinopril (PRINIVIL,ZESTRIL) 40 MG tablet Take 1 tablet (40 mg total) by mouth daily. 90 tablet 1  . metFORMIN (GLUCOPHAGE) 500 MG tablet Take 1 tablet (500 mg total) by mouth 2 (two) times daily with a meal. 60 tablet 11  . metoprolol (LOPRESSOR) 50 MG tablet Take 1 tablet (50 mg total) by mouth 2 (two) times daily. 180 tablet 1  . pravastatin (PRAVACHOL) 80 MG tablet Take 1 tablet (80 mg total) by mouth daily. 90 tablet 1  . traMADol (ULTRAM) 50 MG tablet Take 1 tablet (50 mg total) by mouth every 6 (six) hours as needed. 120 tablet 5   No current facility-administered medications on file prior to visit.    BP 122/82 mmHg  Pulse 71  Temp(Src) 98 F (36.7 C) (Oral)  Ht  (1.778 m)  Wt 205 lb 12.8 oz (93.35 kg)  BMI 29.53 kg/m2  SpO2 97%       Objective:   Physical Exam  General Mental Status- Alert. General Appearance- Not in acute distress.   Skin General: Color- Normal Color. Moisture- Normal Moisture.  Neck Carotid Arteries- Normal color. Moisture- Normal Moisture. No carotid bruits. No JVD.  Chest and Lung Exam Auscultation: Breath Sounds:-Normal.  Cardiovascular Auscultation:Rythm- Regular. Murmurs & Other Heart Sounds:Auscultation of the heart reveals- No Murmurs.  Abdomen Inspection:-Inspeection Normal. Palpation/Percussion:Note:No mass. Palpation and Percussion of the abdomen reveal- Non Tender, Non Distended + BS, no rebound or guarding.    Neurologic Cranial Nerve exam:- CN III-XII intact(No nystagmus), symmetric smile. Strength:- 5/5 equal and symmetric strength both upper and lower extremities.      Assessment & Plan:  Htn- well controlled. Continue same meds.  Hyperlipidemia- will get fasting lipid panel today.  Flu vaccine today and tdap.  Your are working on getting diabetic eye exam and glasses through [facility] and will get papsmear/mammo with [facility]. When those area scheduled  And done please  get copies of  results so we can scan to our records.  For your history of colon cancer in family will get ifob. You express that you don't have funds/declined referral  for colonoscopy. I will refer whenever you are ready/want referral.  If ifob positive for will let pt know and then try to schedule with GI.  Follow up 2 months or as needed.(maybe sooner depending on lab results)

## 2015-07-15 ENCOUNTER — Telehealth: Payer: Self-pay | Admitting: Medical

## 2015-07-15 MED ORDER — FENOFIBRATE 48 MG PO TABS: 48.0000 mg | ORAL_TABLET | Freq: Every day | ORAL | Status: DC

## 2015-07-15 NOTE — Telephone Encounter (Signed)
For her lipids/triglycerides will prescibe her fenofibrate. Will start at low dose. In 3 months if not better then will increase to high dose. I sent the rx in. These were high other day. Repeat lipid panel fasting in 3 months.

## 2015-07-16 NOTE — Telephone Encounter (Signed)
Patient informed of results/instructions.  She did verbally understand an agreed.

## 2015-07-20 ENCOUNTER — Telehealth: Payer: Self-pay | Admitting: Medical

## 2015-07-20 NOTE — Telephone Encounter (Signed)
Pt declined referral for colonoscopy. I had asked her to give ifob to see if any blood in stool. Would you kindly remind her that I want her to do test. Got reminder that result did not come back.

## 2015-07-20 NOTE — Telephone Encounter (Signed)
Spoke with pt and she does not have three samples yet. She was advised to turn it in when she has completed the three samples.

## 2015-07-28 ENCOUNTER — Other Ambulatory Visit (INDEPENDENT_AMBULATORY_CARE_PROVIDER_SITE_OTHER): Payer: Self-pay

## 2015-07-28 DIAGNOSIS — Z800 Family history of malignant neoplasm of digestive organs: Secondary | ICD-10-CM

## 2015-07-28 NOTE — Addendum Note (Signed)
Addended by: Eustace Quail on: 07/28/2015 12:35 PM   Modules accepted: Orders

## 2015-07-29 LAB — FECAL OCCULT BLOOD, IMMUNOCHEMICAL: FECAL OCCULT BLD: NEGATIVE

## 2015-08-19 ENCOUNTER — Other Ambulatory Visit: Payer: Self-pay | Admitting: *Deleted

## 2015-08-19 ENCOUNTER — Other Ambulatory Visit (INDEPENDENT_AMBULATORY_CARE_PROVIDER_SITE_OTHER): Payer: Self-pay | Admitting: *Deleted

## 2015-08-19 ENCOUNTER — Ambulatory Visit (INDEPENDENT_AMBULATORY_CARE_PROVIDER_SITE_OTHER): Payer: Self-pay | Admitting: Internal Medicine

## 2015-08-19 ENCOUNTER — Encounter: Payer: Self-pay | Admitting: Internal Medicine

## 2015-08-19 VITALS — BP 122/64 | HR 75 | Temp 97.8°F | Resp 12 | Wt 205.0 lb

## 2015-08-19 DIAGNOSIS — E1142 Type 2 diabetes mellitus with diabetic polyneuropathy: Secondary | ICD-10-CM

## 2015-08-19 DIAGNOSIS — E1140 Type 2 diabetes mellitus with diabetic neuropathy, unspecified: Secondary | ICD-10-CM

## 2015-08-19 LAB — POCT GLYCOSYLATED HEMOGLOBIN (HGB A1C): Hemoglobin A1C: 6.3

## 2015-08-19 MED ORDER — METFORMIN HCL 500 MG PO TABS: 500.0000 mg | ORAL_TABLET | Freq: Two times a day (BID) | ORAL | Status: DC

## 2015-08-19 MED ORDER — GABAPENTIN 300 MG PO CAPS: 300.0000 mg | ORAL_CAPSULE | Freq: Three times a day (TID) | ORAL | Status: DC

## 2015-08-19 NOTE — Patient Instructions (Signed)
Please continue Metformin 500 mg 2x a day.  Increase Gabapentin to 300 mg in am and 600 mg at bedtime.  Please return in 4 months with your sugar log.

## 2015-08-19 NOTE — Progress Notes (Signed)
Patient ID: Terry Thompson, female   DOB: 05-Oct-1965, 50 y.o.   MRN: [id number]  HPI: Terry Thompson is a 50 y.o.-year-old female, initially referred by Dr. Eden Emms, returning for f/u for DM2, dx in 2014, non-insulin-dependent, uncontrolled, with complications (PN). Last visit 3 mo ago.  Last hemoglobin A1c was: Lab Results  Component Value Date   HGBA1C 6.9 05/21/2015  2014: HbA1c 11%  Pt is on: - Metformin 500 mg 2x a day, with meals  Pt checks sugars 1x a day: - am: 130s >> 127 - 2h after b'fast: n/c >> 145 - before lunch: n/c - 2h after lunch: n/c >> 153, 154 - before dinner: n/c >> 99 - 2h after dinner: n/c >> 145-154 - bedtime: n/c >> 137, 141 - nighttime: n/c No lows. Lowest sugar was 130s; ? hypoglycemia awareness. Highest sugar was 200s >> 154  Glucometer: ReliOn  Pt's meals are: - Brunch: eggs, bacon or sausage, toast; sausage gravy - Dinner: steak + veggies; sloppy joes; chicken pie + potatoes; hamburgers - Snacks: no Used to drink a lot of [employer] (6-7 a day) >> stopped. Still drinks sodas: [employer] Dr Frazier Richards.  She saw nutrition in the past.  - no CKD, last BUN/creatinine:  Lab Results  Component Value Date   BUN 22 06/23/2015   Lab Results  Component Value Date   CREATININE 0.94 06/23/2015   05/29/2014: 7/0.43 - last set of lipids: Lab Results  Component Value Date   CHOL 164 07/13/2015   HDL 35.40* 07/13/2015   LDLDIRECT 81.0 07/13/2015   TRIG 314.0* 07/13/2015   CHOLHDL 5 07/13/2015  Started Fenofibrate 48 mg yesterday. - last eye exam was in 2014. No DR. Insurance does not cover eye exams (?) - + numbness and tingling in her hands and feet. On Neurontin 300 mg x2 at night a day.   She also has a history of HTN, HL, GERD.  ROS: Constitutional: no weight gain/loss, no fatigue, + subjective hypothermia Eyes: no blurry vision, no xerophthalmia ENT: no sore throat, no nodules palpated in throat, no dysphagia/odynophagia, no  hoarseness Cardiovascular: no CP/SOB/palpitations/leg swelling Respiratory: no cough/SOB Gastrointestinal: + all: N/D/C Musculoskeletal: no muscle/joint aches Skin: no rashes Neurological: no tremors/numbness/tingling/dizziness, + HA  I reviewed pt's medications, allergies, PMH, social hx, family hx, and changes were documented in the history of present illness. Otherwise, unchanged from my initial visit note.  Past Medical History  Diagnosis Date  . Diabetes mellitus without complication (HCC)   . GERD (gastroesophageal reflux disease)   . Hyperlipidemia   . Hypertension   . Migraine   . Ingrowing toenail   . Chest pain   . Arthritis     lumbar  . Endometriosis   . Major depressive disorder (HCC)   . Ovarian cyst   . Tendinitis     wrist  . Allergy   . Anxiety    Past Surgical History  Procedure Laterality Date  . Breast biopsy    . Eye surgery    . Gallbladder surgery    . Tubal ligation     Social History   Social History  . Marital Status: Married    Spouse Name: N/A  . Number of Children: 2   Occupational History  .  disabled    Social History Main Topics  . Smoking status: Current Every Day Smoker  . Smokeless tobacco: No  . Alcohol Use: N0  . Drug Use: No   Current Outpatient Prescriptions on File Prior to  Visit  Medication Sig Dispense Refill  . albuterol (PROVENTIL HFA;VENTOLIN HFA) 108 (90 Base) MCG/ACT inhaler Inhale 2 puffs into the lungs every 6 (six) hours as needed for wheezing or shortness of breath. 1 Inhaler 0  . butalbital-acetaminophen-caffeine (FIORICET WITH CODEINE) 50-325-40-30 MG capsule Take 1 capsule by mouth every 4 (four) hours as needed for headache. 30 capsule 3  . Calcium Carbonate-Vitamin D 600-400 MG-UNIT per tablet Take 1 tablet by mouth daily.    . ciprofloxacin (CIPRO) 500 MG tablet Take 1 tablet (500 mg total) by mouth 2 (two) times daily. 14 tablet 0  . esomeprazole (NEXIUM) 40 MG packet Take 40 mg by mouth daily before  breakfast. 90 each 1  . fenofibrate (TRICOR) 48 MG tablet Take 1 tablet (48 mg total) by mouth daily. 30 tablet 0  . gabapentin (NEURONTIN) 300 MG capsule Take 1 capsule (300 mg total) by mouth 3 (three) times daily. 90 capsule 3  . hydrochlorothiazide (HYDRODIURIL) 25 MG tablet Take 1 tablet (25 mg total) by mouth daily. 90 tablet 1  . lisinopril (PRINIVIL,ZESTRIL) 40 MG tablet Take 1 tablet (40 mg total) by mouth daily. 90 tablet 1  . metFORMIN (GLUCOPHAGE) 500 MG tablet Take 1 tablet (500 mg total) by mouth 2 (two) times daily with a meal. 60 tablet 11  . metoprolol (LOPRESSOR) 50 MG tablet Take 1 tablet (50 mg total) by mouth 2 (two) times daily. 180 tablet 1  . pravastatin (PRAVACHOL) 80 MG tablet Take 1 tablet (80 mg total) by mouth daily. 90 tablet 1  . sertraline (ZOLOFT) 100 MG tablet Take 100 mg by mouth daily. Take 1.5 tablets daily.    . traMADol (ULTRAM) 50 MG tablet Take 1 tablet (50 mg total) by mouth every 6 (six) hours as needed. 120 tablet 5   No current facility-administered medications on file prior to visit.   Allergies  Allergen Reactions  . Naproxen Other (See Comments)    unkown   Family History  Problem Relation Age of Onset  . Heart disease Father   . Heart disease Brother   . Diabetes Brother    PE: BP 122/64 mmHg  Pulse 75  Temp(Src) 97.8 F (36.6 C) (Oral)  Resp 12  Wt 205 lb (92.987 kg)  SpO2 96% Wt Readings from Last 3 Encounters:  08/19/15 205 lb (92.987 kg)  07/13/15 205 lb 12.8 oz (93.35 kg)  06/23/15 205 lb 6.4 oz (93.169 kg)   Constitutional: overweight, in NAD, walks with a cane Eyes: PERRLA, EOMI, no exophthalmos ENT: moist mucous membranes, no thyromegaly, no cervical lymphadenopathy Cardiovascular: RRR, No MRG Respiratory: CTA B Gastrointestinal: abdomen soft, NT, ND, BS+ Musculoskeletal: no deformities, strength intact in all 4 Skin: moist, warm, no rashes Neurological: no tremor with outstretched hands, DTR normal in all  4  ASSESSMENT: 1. DM2, non-insulin-dependent, controlled, with complications - PN  2. Diabetic peripheral neuropathy  PLAN:  1. Patient with 2 years h/o diabetes, with good control (last visit's HbA1c was 6.9%), doing well on half-maximal Metformin dose - I suggested to:  Patient Instructions  Please continue Metformin 500 mg 2x a day.  Increase Gabapentin to 300 mg in am and 600 mg at bedtime.  Please return in 4 months with your sugar log.   - continue checking sugars at different times of the day - check 1 times a day, rotating checks - advised for yearly eye exams >> she needs to schedule a new one! >> she is trying to do this -  Return to clinic in 4 mo with sugar log   2. Diabetic peripheral neuropathy - She was on gabapentin 300 mg 2x a day (ran out >> needs a refill), and I advised her to move the 2 tablets at night >> she did this, but started to have pain also during the day >> will increase the dose to 300 mg in am and 600 mg at night  Requested Prescriptions   Signed Prescriptions Disp Refills  . gabapentin (NEURONTIN) 300 MG capsule 270 capsule 3    Sig: Take 1 capsule (300 mg total) by mouth 3 (three) times daily.  . metFORMIN (GLUCOPHAGE) 500 MG tablet 180 tablet 3    Sig: Take 1 tablet (500 mg total) by mouth 2 (two) times daily with a meal.

## 2015-08-24 ENCOUNTER — Other Ambulatory Visit: Payer: Self-pay

## 2015-08-24 MED ORDER — FENOFIBRATE 48 MG PO TABS: 48.0000 mg | ORAL_TABLET | Freq: Every day | ORAL | Status: DC

## 2015-09-10 ENCOUNTER — Ambulatory Visit (INDEPENDENT_AMBULATORY_CARE_PROVIDER_SITE_OTHER): Payer: Self-pay | Admitting: Medical

## 2015-09-10 ENCOUNTER — Telehealth: Payer: Self-pay | Admitting: Medical

## 2015-09-10 ENCOUNTER — Ambulatory Visit (HOSPITAL_BASED_OUTPATIENT_CLINIC_OR_DEPARTMENT_OTHER)
Admission: RE | Admit: 2015-09-10 | Discharge: 2015-09-10 | Disposition: A | Discharge status: Home / Self Care | Payer: Self-pay | Source: Ambulatory Visit | Attending: Medical | Admitting: Medical

## 2015-09-10 ENCOUNTER — Encounter: Payer: Self-pay | Admitting: Medical

## 2015-09-10 VITALS — BP 118/78 | HR 88 | Temp 97.5°F | Ht 70.0 in | Wt 204.8 lb

## 2015-09-10 DIAGNOSIS — Z9049 Acquired absence of other specified parts of digestive tract: Secondary | ICD-10-CM | POA: Insufficient documentation

## 2015-09-10 DIAGNOSIS — I700 Atherosclerosis of aorta: Secondary | ICD-10-CM | POA: Insufficient documentation

## 2015-09-10 DIAGNOSIS — E785 Hyperlipidemia, unspecified: Secondary | ICD-10-CM

## 2015-09-10 DIAGNOSIS — R1013 Epigastric pain: Secondary | ICD-10-CM

## 2015-09-10 DIAGNOSIS — I10 Essential (primary) hypertension: Secondary | ICD-10-CM

## 2015-09-10 DIAGNOSIS — K5900 Constipation, unspecified: Secondary | ICD-10-CM | POA: Insufficient documentation

## 2015-09-10 DIAGNOSIS — M542 Cervicalgia: Secondary | ICD-10-CM | POA: Insufficient documentation

## 2015-09-10 DIAGNOSIS — M50322 Other cervical disc degeneration at C5-C6 level: Secondary | ICD-10-CM | POA: Insufficient documentation

## 2015-09-10 DIAGNOSIS — I708 Atherosclerosis of other arteries: Secondary | ICD-10-CM | POA: Insufficient documentation

## 2015-09-10 DIAGNOSIS — E1140 Type 2 diabetes mellitus with diabetic neuropathy, unspecified: Secondary | ICD-10-CM

## 2015-09-10 LAB — CBC WITH DIFFERENTIAL/PLATELET
BASOS ABS: 0.1 10*3/uL (ref 0.0–0.1)
Basophils Relative: 0.6 % (ref 0.0–3.0)
EOS ABS: 0.2 10*3/uL (ref 0.0–0.7)
Eosinophils Relative: 1.9 % (ref 0.0–5.0)
HEMATOCRIT: 41.3 % (ref 36.0–46.0)
Hemoglobin: 13.8 g/dL (ref 12.0–15.0)
LYMPHS ABS: 3.9 10*3/uL (ref 0.7–4.0)
LYMPHS PCT: 32.1 % (ref 12.0–46.0)
MCHC: 33.3 g/dL (ref 30.0–36.0)
MCV: 89.7 fl (ref 78.0–100.0)
MONOS PCT: 4.5 % (ref 3.0–12.0)
Monocytes Absolute: 0.5 10*3/uL (ref 0.1–1.0)
NEUTROS PCT: 60.9 % (ref 43.0–77.0)
Neutro Abs: 7.4 10*3/uL (ref 1.4–7.7)
Platelets: 246 10*3/uL (ref 150.0–400.0)
RBC: 4.61 Mil/uL (ref 3.87–5.11)
RDW: 13.2 % (ref 11.5–15.5)
WBC: 12.1 10*3/uL — ABNORMAL HIGH (ref 4.0–10.5)

## 2015-09-10 LAB — COMPREHENSIVE METABOLIC PANEL
ALK PHOS: 52 U/L (ref 39–117)
ALT: 12 U/L (ref 0–35)
AST: 14 U/L (ref 0–37)
Albumin: 4.6 g/dL (ref 3.5–5.2)
BILIRUBIN TOTAL: 0.4 mg/dL (ref 0.2–1.2)
BUN: 27 mg/dL — AB (ref 6–23)
CO2: 24 mEq/L (ref 19–32)
CREATININE: 1.25 mg/dL — AB (ref 0.40–1.20)
Calcium: 10.2 mg/dL (ref 8.4–10.5)
Chloride: 101 mEq/L (ref 96–112)
GFR: 47.85 mL/min — AB (ref >60.00)
GLUCOSE: 97 mg/dL (ref 70–99)
Potassium: 5 mEq/L (ref 3.5–5.1)
Sodium: 133 mEq/L — ABNORMAL LOW (ref 135–145)
TOTAL PROTEIN: 7.9 g/dL (ref 6.0–8.3)

## 2015-09-10 LAB — H. PYLORI BREATH TEST: H. pylori Breath Test: NOT DETECTED

## 2015-09-10 LAB — LIPASE: Lipase: 37 U/L (ref 11.0–59.0)

## 2015-09-10 LAB — AMYLASE: Amylase: 31 U/L (ref 27–131)

## 2015-09-10 MED ORDER — RANITIDINE HCL 150 MG PO CAPS: 150.0000 mg | ORAL_CAPSULE | Freq: Two times a day (BID) | ORAL | Status: DC

## 2015-09-10 MED FILL — raNITIdine HCL 150 MG TABS: 150 | 30 days supply | Qty: 60 | Fill #0

## 2015-09-10 NOTE — Progress Notes (Signed)
Pre visit review using our clinic review tool, if applicable. No additional management support is needed unless otherwise documented below in the visit note. 

## 2015-09-10 NOTE — Telephone Encounter (Signed)
Amylase, lipase and h pylori were negative.

## 2015-09-10 NOTE — Patient Instructions (Addendum)
Your bp and diabetes are well controlled recenlty. Continue same meds.  For lipids continue same meds. Too early to test. But repeat testing end of April.  For your constipation with intermittent loose stools can use metamucil otc 1 rounded tablespoon in 8 oz water three times daily.  Will get abd xray today to assess stool present and bowel pattern.  For reflux history and abd pain get labs and add ranitidine to your omeprazole otc  Follow up in 3 weeks or as needed. Any abdomen pain severe then ED evaluation.  Will go ahead and refer you to GI as well.

## 2015-09-10 NOTE — Progress Notes (Signed)
Subjective:    Patient ID: Terry Thompson, female    DOB: 02/28/1966, 50 y.o.   MRN: [id number]  HPI  Pt in for follow up. Pt blood pressure is under control. Pt when she check her bp is controlled but she checks rarely. When checks is controlled. No cardiac or neurologic signs or symptoms.  Pt has been seeing endocrinologist. Her a1-c recently 6.3. Was 11.1 in the past.  Pt ifob test was negative.  Pt triglycerides were high on last visit. I rx'd fenofibrate.  Pt is still waiting on Eye MD appointment through [facility]. They sponsor.  Pt papsmear will possibly due September 22, 2015. Mammogram scheduled for April 5th.   Pt still declined colonoscopy.   Pt in stating history of constipation since young age/kid and even as adult. Recently past 2 months feels constipated then will have loose stools. Pt get occaisional cramping pain in abdomen. Stomach feel bubbling. She has reflux history. She is on omeprazole. Pt not had any work up for her stomach in the past.  Last bm this am was loose stool. Yesterday no bm. In past sometimes no bm for 10 days.   Pt surgery lap gallbladder and tubal ligation.(are her abd/pelvic surgeries)    Review of Systems  Constitutional: Negative for fever, chills and fatigue.  Respiratory: Positive for cough. Negative for chest tightness, shortness of breath and wheezing.        When lies flat will cough.  Gastrointestinal: Positive for abdominal pain and constipation. Negative for nausea, vomiting, diarrhea, blood in stool, abdominal distention, anal bleeding and rectal pain.       Varies. See hpi.  Genitourinary: Negative for dysuria and difficulty urinating.  Musculoskeletal: Negative for back pain.  Skin: Negative for pallor and rash.  Neurological: Negative for dizziness and headaches.  Hematological: Negative for adenopathy. Does not bruise/bleed easily.    Past Medical History  Diagnosis Date  . Diabetes mellitus without complication (HCC)   .  GERD (gastroesophageal reflux disease)   . Hyperlipidemia   . Hypertension   . Migraine   . Ingrowing toenail   . Chest pain   . Arthritis     lumbar  . Endometriosis   . Major depressive disorder (HCC)   . Ovarian cyst   . Tendinitis     wrist  . Allergy   . Anxiety     Social History   Social History  . Marital Status: Married    Spouse Name: N/A  . Number of Children: N/A  . Years of Education: N/A   Occupational History  . Not on file.   Social History Main Topics  . Smoking status: Current Every Day Smoker  . Smokeless tobacco: Not on file  . Alcohol Use: Not on file     Comment: did not ask today.  . Drug Use: No  . Sexual Activity: Yes   Other Topics Concern  . Not on file   Social History Narrative    Past Surgical History  Procedure Laterality Date  . Breast biopsy    . Eye surgery    . Gallbladder surgery    . Tubal ligation      Family History  Problem Relation Age of Onset  . Heart disease Father   . Heart disease Brother   . Diabetes Brother     Allergies  Allergen Reactions  . Naproxen Other (See Comments)    unkown    Current Outpatient Prescriptions on File Prior to Visit  Medication Sig Dispense Refill  . albuterol (PROVENTIL HFA;VENTOLIN HFA) 108 (90 Base) MCG/ACT inhaler Inhale 2 puffs into the lungs every 6 (six) hours as needed for wheezing or shortness of breath. 1 Inhaler 0  . butalbital-acetaminophen-caffeine (FIORICET WITH CODEINE) 50-325-40-30 MG capsule Take 1 capsule by mouth every 4 (four) hours as needed for headache. 30 capsule 3  . Calcium Carbonate-Vitamin D 600-400 MG-UNIT per tablet Take 1 tablet by mouth daily.    . ciprofloxacin (CIPRO) 500 MG tablet Take 1 tablet (500 mg total) by mouth 2 (two) times daily. 14 tablet 0  . esomeprazole (NEXIUM) 40 MG packet Take 40 mg by mouth daily before breakfast. 90 each 1  . fenofibrate (TRICOR) 48 MG tablet Take 1 tablet (48 mg total) by mouth daily. 30 tablet 0  .  gabapentin (NEURONTIN) 300 MG capsule Take 1 capsule (300 mg total) by mouth 3 (three) times daily. 270 capsule 3  . hydrochlorothiazide (HYDRODIURIL) 25 MG tablet Take 1 tablet (25 mg total) by mouth daily. 90 tablet 1  . lisinopril (PRINIVIL,ZESTRIL) 40 MG tablet Take 1 tablet (40 mg total) by mouth daily. 90 tablet 1  . metFORMIN (GLUCOPHAGE) 500 MG tablet Take 1 tablet (500 mg total) by mouth 2 (two) times daily with a meal. 180 tablet 3  . metoprolol (LOPRESSOR) 50 MG tablet Take 1 tablet (50 mg total) by mouth 2 (two) times daily. 180 tablet 1  . pravastatin (PRAVACHOL) 80 MG tablet Take 1 tablet (80 mg total) by mouth daily. 90 tablet 1  . sertraline (ZOLOFT) 100 MG tablet Take 100 mg by mouth daily. Take 1.5 tablets daily.    . traMADol (ULTRAM) 50 MG tablet Take 1 tablet (50 mg total) by mouth every 6 (six) hours as needed. 120 tablet 5   No current facility-administered medications on file prior to visit.    BP 118/78 mmHg  Pulse 88  Temp(Src) 97.5 F (36.4 C) (Oral)  Ht  (1.778 m)  Wt 204 lb 12.8 oz (92.897 kg)  BMI 29.39 kg/m2  SpO2 95%       Objective:   Physical Exam  General Appearance- Not in acute distress.  HEENT Eyes- Scleraeral/Conjuntiva-bilat- Not Yellow. Mouth & Throat- Normal.  Chest and Lung Exam Auscultation: Breath sounds:-Normal. Adventitious sounds:- No Adventitious sounds.  Cardiovascular Auscultation:Rythm - Regular. Heart Sounds -Normal heart sounds.  Abdomen Inspection:-Inspection Normal.  Palpation/Perucssion: Palpation and Percussion of the abdomen reveal- mild to moderate  Tender epigastric, No Rebound tenderness, No rigidity(Guarding) and No Palpable abdominal masses.  Liver:-Normal.  Spleen:- Normal.   Back- no cva tenderness.      Assessment & Plan:  Your bp and diabetes are well controlled recenlty. Continue same meds.  For lipids continue same meds. Too early to test. But repeat testing end of April.  For your  constipation with intermittent loose stools can use metamucil otc 1 rounded tablespoon in 8 oz water three times daily.  Will get abd xray today to assess stool present and bowel pattern.  For reflux history and abd pain get labs and add ranitidine to your omeprazole.  Follow up in 3 weeks or as needed. Any abdomen pain severe then ED evaluation.  Will go ahead and refer you to GI as well.

## 2015-09-13 ENCOUNTER — Encounter: Payer: Self-pay | Admitting: Gastroenterology

## 2015-09-28 ENCOUNTER — Ambulatory Visit (INDEPENDENT_AMBULATORY_CARE_PROVIDER_SITE_OTHER): Payer: Self-pay | Admitting: Medical

## 2015-09-28 ENCOUNTER — Encounter: Payer: Self-pay | Admitting: Medical

## 2015-09-28 VITALS — BP 116/76 | HR 66 | Temp 97.7°F | Ht 70.0 in | Wt 203.8 lb

## 2015-09-28 DIAGNOSIS — R944 Abnormal results of kidney function studies: Secondary | ICD-10-CM

## 2015-09-28 DIAGNOSIS — R1013 Epigastric pain: Secondary | ICD-10-CM

## 2015-09-28 DIAGNOSIS — M545 Low back pain: Secondary | ICD-10-CM

## 2015-09-28 DIAGNOSIS — D72829 Elevated white blood cell count, unspecified: Secondary | ICD-10-CM

## 2015-09-28 DIAGNOSIS — K589 Irritable bowel syndrome without diarrhea: Secondary | ICD-10-CM

## 2015-09-28 MED ORDER — ALBUTEROL SULFATE HFA 108 (90 BASE) MCG/ACT IN AERS: 2.0000 | INHALATION_SPRAY | Freq: Four times a day (QID) | RESPIRATORY_TRACT | Status: AC | PRN

## 2015-09-28 MED ORDER — RANITIDINE HCL 150 MG PO CAPS: 150.0000 mg | ORAL_CAPSULE | Freq: Two times a day (BID) | ORAL | Status: AC

## 2015-09-28 MED ORDER — METFORMIN HCL 500 MG PO TABS: 500.0000 mg | ORAL_TABLET | Freq: Two times a day (BID) | ORAL | Status: AC

## 2015-09-28 MED ORDER — HYDROCHLOROTHIAZIDE 25 MG PO TABS: 25.0000 mg | ORAL_TABLET | Freq: Every day | ORAL | Status: DC

## 2015-09-28 MED ORDER — METOPROLOL TARTRATE 50 MG PO TABS: 50.0000 mg | ORAL_TABLET | Freq: Two times a day (BID) | ORAL | Status: AC

## 2015-09-28 MED ORDER — LISINOPRIL 40 MG PO TABS: 40.0000 mg | ORAL_TABLET | Freq: Every day | ORAL | Status: DC

## 2015-09-28 MED ORDER — GABAPENTIN 300 MG PO CAPS: 300.0000 mg | ORAL_CAPSULE | Freq: Three times a day (TID) | ORAL | Status: AC

## 2015-09-28 NOTE — Progress Notes (Signed)
Subjective:    Patient ID: Terry Thompson, female    DOB: August 23, 1965, 50 y.o.   MRN: [id number]  HPI  Pt in for follow up on her abdomen pain. She had possible ibs with intermittent episodes of diarrhea and constipation. Pt states has not tried metamucil recently. Pt xray showed no abnormality.  Pt has not been using zantac. I had asked her get that med filled. The pharmacy was going to charge her $50 dollars.  Pt still having daily epigastric pain and feeling nausea. No vomiting today. Last time vomited was last night. Hx of intermittent vomiting. Pt has appointment with GI on Nov 03, 2015.  Pt last bm was yesterday.  Pt on blood test last time showed mild wbc increase, low and decreased.  Also pain in her neck that radiates to both arms at times. Pt had djd with possible encroachment on nerve at foramen.    Review of Systems  Constitutional: Negative for fever, chills, diaphoresis, activity change and fatigue.  Respiratory: Negative for cough, chest tightness and shortness of breath.   Cardiovascular: Negative for chest pain, palpitations and leg swelling.  Gastrointestinal: Positive for nausea and abdominal pain. Negative for vomiting.  Genitourinary: Negative for dysuria, urgency, frequency, difficulty urinating and genital sores.  Musculoskeletal: Positive for back pain. Negative for neck pain and neck stiffness.       Rt cva area.  Skin: Negative for rash.  Neurological: Negative for dizziness, seizures, weakness and headaches.  Psychiatric/Behavioral: Negative for behavioral problems, confusion and agitation. The patient is not nervous/anxious.    Past Medical History  Diagnosis Date  . Diabetes mellitus without complication (HCC)   . GERD (gastroesophageal reflux disease)   . Hyperlipidemia   . Hypertension   . Migraine   . Ingrowing toenail   . Chest pain   . Arthritis     lumbar  . Endometriosis   . Major depressive disorder (HCC)   . Ovarian cyst   .  Tendinitis     wrist  . Allergy   . Anxiety     Social History   Social History  . Marital Status: Married    Spouse Name: N/A  . Number of Children: N/A  . Years of Education: N/A   Occupational History  . Not on file.   Social History Main Topics  . Smoking status: Current Every Day Smoker  . Smokeless tobacco: Not on file  . Alcohol Use: Not on file     Comment: did not ask today.  . Drug Use: No  . Sexual Activity: Yes   Other Topics Concern  . Not on file   Social History Narrative    Past Surgical History  Procedure Laterality Date  . Breast biopsy    . Eye surgery    . Gallbladder surgery    . Tubal ligation      Family History  Problem Relation Age of Onset  . Heart disease Father   . Heart disease Brother   . Diabetes Brother     Allergies  Allergen Reactions  . Naproxen Other (See Comments)    unkown    Current Outpatient Prescriptions on File Prior to Visit  Medication Sig Dispense Refill  . butalbital-acetaminophen-caffeine (FIORICET WITH CODEINE) 50-325-40-30 MG capsule Take 1 capsule by mouth every 4 (four) hours as needed for headache. 30 capsule 3  . Calcium Carbonate-Vitamin D 600-400 MG-UNIT per tablet Take 1 tablet by mouth daily.    . fenofibrate (TRICOR) 48 MG  tablet Take 1 tablet (48 mg total) by mouth daily. 30 tablet 0  . pravastatin (PRAVACHOL) 80 MG tablet Take 1 tablet (80 mg total) by mouth daily. 90 tablet 1  . sertraline (ZOLOFT) 100 MG tablet Take 100 mg by mouth daily. Take 1.5 tablets daily.    . traMADol (ULTRAM) 50 MG tablet Take 1 tablet (50 mg total) by mouth every 6 (six) hours as needed. 120 tablet 5   No current facility-administered medications on file prior to visit.    BP 116/76 mmHg  Pulse 66  Temp(Src) 97.7 F (36.5 C) (Oral)  Ht 5\' 10"  (1.778 m)  Wt 203 lb 12.8 oz (92.443 kg)  BMI 29.24 kg/m2  SpO2 98%      Objective:   Physical Exam  General Appearance- Not in acute distress.  HEENT Eyes-  Scleraeral/Conjuntiva-bilat- Not Yellow. Mouth & Throat- Normal.  Chest and Lung Exam Auscultation: Breath sounds:-Normal. Adventitious sounds:- No Adventitious sounds.  Cardiovascular Auscultation:Rythm - Regular. Heart Sounds -Normal heart sounds.  Abdomen Inspection:-Inspection Normal.  Palpation/Perucssion: Palpation and Percussion of the abdomen reveal- mild epigastric  Tender, No Rebound tenderness, No rigidity(Guarding) and No Palpable abdominal masses.  Liver:-Normal.  Spleen:- Normal.   Back- faint rt cva tenderness.         Assessment & Plan:  For your abdomen pain and possible ibs, I want you to start zantac and the metamucil.  For nausea rx zofran.  See GI as you are already scheduled.  Get cbc and cmp today.  For your back pain which you state has been present before will go ahead and get ua and culture.   You can check on price of c-spine mri. And I wil put the order in if you want me to.  Follow up in 6 weeks or as needed

## 2015-09-28 NOTE — Progress Notes (Signed)
Pre visit review using our clinic review tool, if applicable. No additional management support is needed unless otherwise documented below in the visit note. 

## 2015-09-28 NOTE — Patient Instructions (Signed)
For your abdomen pain and possible ibs, I want you to start zantac and the metamucil.  For nausea rx zofran.  See GI as you are already scheduled.  Get cbc and cmp today.  For your back pain which you state has been present before will go ahead and get ua and culture.   You can check on price of c-spine mri. And I wil put the order in if you want me to.  Follow up in 6 weeks or as needed

## 2015-09-29 ENCOUNTER — Other Ambulatory Visit: Payer: Self-pay

## 2015-09-29 LAB — POC URINALSYSI DIPSTICK (AUTOMATED)
Bilirubin, UA: NEGATIVE
Blood, UA: NEGATIVE
Glucose, UA: NEGATIVE
Ketones, UA: NEGATIVE
Leukocytes, UA: NEGATIVE
NITRITE UA: NEGATIVE
PROTEIN UA: NEGATIVE
UROBILINOGEN UA: 0.2
pH, UA: 6

## 2015-09-29 LAB — COMPREHENSIVE METABOLIC PANEL
ALBUMIN: 4.5 g/dL (ref 3.5–5.2)
ALT: 13 U/L (ref 0–35)
AST: 12 U/L (ref 0–37)
Alkaline Phosphatase: 54 U/L (ref 39–117)
BUN: 30 mg/dL — ABNORMAL HIGH (ref 6–23)
CALCIUM: 10.2 mg/dL (ref 8.4–10.5)
CHLORIDE: 103 meq/L (ref 96–112)
CO2: 22 mEq/L (ref 19–32)
CREATININE: 1.56 mg/dL — AB (ref 0.40–1.20)
GFR: 37.05 mL/min — AB (ref >60.00)
Glucose, Bld: 93 mg/dL (ref 70–99)
POTASSIUM: 4.8 meq/L (ref 3.5–5.1)
Sodium: 135 mEq/L (ref 135–145)
Total Bilirubin: 0.3 mg/dL (ref 0.2–1.2)
Total Protein: 7.6 g/dL (ref 6.0–8.3)

## 2015-09-29 LAB — CBC WITH DIFFERENTIAL/PLATELET
BASOS PCT: 0.7 % (ref 0.0–3.0)
Basophils Absolute: 0.1 10*3/uL (ref 0.0–0.1)
EOS PCT: 2 % (ref 0.0–5.0)
Eosinophils Absolute: 0.2 10*3/uL (ref 0.0–0.7)
HEMATOCRIT: 41 % (ref 36.0–46.0)
HEMOGLOBIN: 13.5 g/dL (ref 12.0–15.0)
LYMPHS PCT: 33 % (ref 12.0–46.0)
Lymphs Abs: 3.9 10*3/uL (ref 0.7–4.0)
MCHC: 32.8 g/dL (ref 30.0–36.0)
MCV: 90.6 fl (ref 78.0–100.0)
Monocytes Absolute: 0.3 10*3/uL (ref 0.1–1.0)
Monocytes Relative: 2.3 % — ABNORMAL LOW (ref 3.0–12.0)
NEUTROS ABS: 7.3 10*3/uL (ref 1.4–7.7)
Neutrophils Relative %: 62 % (ref 43.0–77.0)
PLATELETS: 220 10*3/uL (ref 150.0–400.0)
RBC: 4.53 Mil/uL (ref 3.87–5.11)
RDW: 13.7 % (ref 11.5–15.5)
WBC: 11.7 10*3/uL — AB (ref 4.0–10.5)

## 2015-09-29 MED ORDER — FENOFIBRATE 48 MG PO TABS: 48.0000 mg | ORAL_TABLET | Freq: Every day | ORAL | Status: AC

## 2015-09-29 NOTE — Telephone Encounter (Signed)
Rx filled 09/29/15.

## 2015-09-29 NOTE — Addendum Note (Signed)
Addended by: Harley AltoPRICE, KRISTY M on: 09/29/2015 10:09 AM   Modules accepted: Orders

## 2015-09-30 ENCOUNTER — Other Ambulatory Visit: Payer: Self-pay

## 2015-09-30 NOTE — Telephone Encounter (Signed)
Spoke with pt and voices understanding. Pt states that she was not able to afford the MRI and she was told to get an orange card to help with paying for tests such as MRI's and CT. Per Migdalia DkJennifer Sebastian pt will need to contact billing department for the information. Patient wanted to let pcp know that she was not able to get MRI at this time. She will call the billing department to get the orange card for assistance.

## 2015-09-30 NOTE — Addendum Note (Signed)
Addended by: Neldon LabellaMABE, Maygen Sirico S on: 09/30/2015 11:24 AM   Modules accepted: Orders

## 2015-10-01 LAB — URINE CULTURE

## 2015-10-04 NOTE — Progress Notes (Signed)
Quick Note:  Pt has seen results on MyChart and message also sent for patient to call back if any questions. ______ 

## 2015-10-12 ENCOUNTER — Telehealth: Payer: Self-pay

## 2015-10-12 NOTE — Telephone Encounter (Signed)
Received call from nurse at Dr. Darryl NestleBijelacs office at [facility]. States patients BP is low and she is having headaches. Asked nurse what BP is, states it is 70/49 and that highest it had been  Today is 84/56. Asked nurse if patient was alone and she stated patient was accompanied by husband. Advise that with BP that low patient needs to go directly to ED. Nurse states she will advise/instruct patient and her husband.

## 2015-10-12 NOTE — Telephone Encounter (Signed)
Sorry wrong chart, agree with ED visit.

## 2015-10-12 NOTE — Telephone Encounter (Signed)
OK to refill Ambien 10 mg with same sig, #30 with 1 rf. For pan needs Tylenol 500 mg 1-2 tabs po tid prn pain with Lidocaine patches to painful areas daily. Apply moist heat

## 2015-10-12 NOTE — Telephone Encounter (Signed)
Call pt tomorrow. How is she? Did she go to the ED?

## 2015-10-12 NOTE — Telephone Encounter (Signed)
Noted. Agree with advice given.  Called to follow up with patient.  Husband picked up and stated that they were currently at the [facility] ER.  BP has improved, 96/60.  However they are still running a variety of tests.  Husband was asked to call us with any questions or concerns.   He stated understanding and was very appreciative for the call.

## 2015-10-13 NOTE — Telephone Encounter (Signed)
Pt is following up in a couple of weeks. Does she need to be seen sooner? Please advise.

## 2015-10-26 ENCOUNTER — Ambulatory Visit: Payer: Self-pay | Admitting: Cardiovascular Disease

## 2015-10-26 NOTE — Progress Notes (Signed)
Patient ID: Terry Thompson, female   DOB: April 12, 1966, 50 y.o.   MRN: [id number]     Cardiology Office Note   Date:  10/28/2015   ID:  Terry Thompson, DOB 10/15/1965, MRN [id number]  PCP:  [facility]   Cardiologist:   Charlton Haws, MD   No chief complaint on file.     History of Present Illness: Terry Thompson is a 50 y.o. female  Previously seen for  abnormal ECG  Had routine physical with [facility] on 08/11/14  Apparently had atypical chest pain and evaluated at [facility] ER.  R/O negative troponins ECG with non specific ST changes.  CXR with bronchitis given antibiotics Also had GERD like pain Rx with nexium with ocassional nausea CRF include type 2 DM, HTN and elevated lipids on Rx GB removed in 1990 No recent pain.  She has significant clinical COPD/Bronchitis.    Seeing Dr Dan Europe for DM and it's associated neuropathy  Lab Results  Component Value Date   HGBA1C 6.3 08/19/2015    Echo done 4/14 /16 normal Study Conclusions  - Left ventricle: The cavity size was normal. Systolic function was normal. The estimated ejection fraction was in the range of 55% to 60%. Wall motion was normal; there were no regional wall motion abnormalities. Left ventricular diastolic function parameters were normal. - Mitral valve: Calcified annulus. Mildly thickened leaflets . - Pulmonary arteries: Systolic pressure was mildly increased. PA peak pressure: 31 mm Hg (S).  Was hospitalized for chest pain in [facility] end of April  2016  R/O no stress testing or cath done ? GI in nature  F/U myovue here in May 2016  was normal with no ischemia   10/14/15 Hospitalized [facility] for low BP dehydration and azotemia.  Diuretic and ACE stopped iv hydration ? UTI  D/C home after a day Since d/c feels stronger has f/u with community health.  CR 1.5 on d/c   Past Medical History  Diagnosis Date  . Diabetes mellitus without complication (HCC)   . GERD (gastroesophageal reflux disease)   .  Hyperlipidemia   . Hypertension   . Migraine   . Ingrowing toenail   . Chest pain   . Arthritis     lumbar  . Endometriosis   . Major depressive disorder (HCC)   . Ovarian cyst   . Tendinitis     wrist  . Allergy   . Anxiety     Past Surgical History  Procedure Laterality Date  . Breast biopsy    . Eye surgery    . Gallbladder surgery    . Tubal ligation       Current Outpatient Prescriptions  Medication Sig Dispense Refill  . albuterol (PROVENTIL HFA;VENTOLIN HFA) 108 (90 Base) MCG/ACT inhaler Inhale 2 puffs into the lungs every 6 (six) hours as needed for wheezing or shortness of breath. 1 Inhaler 0  . butalbital-acetaminophen-caffeine (FIORICET WITH CODEINE) 50-325-40-30 MG capsule Take 1 capsule by mouth every 4 (four) hours as needed for headache. 30 capsule 3  . Calcium Carbonate-Vitamin D 600-400 MG-UNIT per tablet Take 1 tablet by mouth daily.    . fenofibrate (TRICOR) 48 MG tablet Take 1 tablet (48 mg total) by mouth daily. 30 tablet 3  . gabapentin (NEURONTIN) 300 MG capsule Take 1 capsule (300 mg total) by mouth 3 (three) times daily. 270 capsule 0  . metFORMIN (GLUCOPHAGE) 500 MG tablet Take 1 tablet (500 mg total) by mouth 2 (two) times daily with  a meal. 180 tablet 0  . metoprolol (LOPRESSOR) 50 MG tablet Take 1 tablet (50 mg total) by mouth 2 (two) times daily. 180 tablet 0  . pravastatin (PRAVACHOL) 80 MG tablet Take 1 tablet (80 mg total) by mouth daily. 90 tablet 1  . ranitidine (ZANTAC) 150 MG capsule Take 1 capsule (150 mg total) by mouth 2 (two) times daily. 60 capsule 0  . sertraline (ZOLOFT) 100 MG tablet Take 100 mg by mouth daily. Take 1.5 tablets daily.    . traMADol (ULTRAM) 50 MG tablet Take 1 tablet (50 mg total) by mouth every 6 (six) hours as needed. 120 tablet 5   No current facility-administered medications for this visit.    Allergies:   Naproxen    Social History:  The patient  reports that she has been smoking.  She does not have any  smokeless tobacco history on file. She reports that she does not use illicit drugs.   Family History:  The patient's family history includes Diabetes in her brother; Heart disease in her brother and father.    ROS:  Please see the history of present illness.   Otherwise, review of systems are positive for none.   All other systems are reviewed and negative.    PHYSICAL EXAM: VS:  BP 140/76 mmHg  Pulse 82  Ht 5' 9.5" (1.765 m)  Wt 93.35 kg (205 lb 12.8 oz)  BMI 29.97 kg/m2 , BMI Body mass index is 29.97 kg/(m^2). Chronically ill white female  HEENT: strabismus with lazy right eye  Neck: no JVD, carotid bruits, or masses Cardiac:  RRR; no murmurs, rubs, or gallops,no edema  Respiratory:  Diffuse bronchitic changes and rhonchi with exp wheezing  GI: soft, nontender, nondistended, + BS MS: no deformity or atrophy Skin: warm and dry, no rash Neuro:  Strength and sensation are intact Psych: euthymic mood, full affect   EKG:   SR rate 77  T wave inversions in I, and AVL  05/29/14    09/24/14  SR rate 115  T wave inversions I,AVL  Tachy rate 115 not taking her inderal last 3 days  10/28/15 SR ate 82 normal    Recent Labs: 09/28/2015: ALT 13; BUN 30*; Creatinine, Ser 1.56*; Hemoglobin 13.5; Platelets 220.0; Potassium 4.8; Sodium 135    Lipid Panel    Component Value Date/Time   CHOL 164 07/13/2015 1026   TRIG 314.0* 07/13/2015 1026   HDL 35.40* 07/13/2015 1026   CHOLHDL 5 07/13/2015 1026   VLDL 62.8* 07/13/2015 1026   LDLDIRECT 81.0 07/13/2015 1026     Lab Results  Component Value Date   HGBA1C 6.3 08/19/2015     Wt Readings from Last 3 Encounters:  10/28/15 93.35 kg (205 lb 12.8 oz)  09/28/15 92.443 kg (203 lb 12.8 oz)  09/10/15 92.897 kg (204 lb 12.8 oz)      Other studies Reviewed: Additional studies/ records that were reviewed today include: Primary Care records [facility] see HPI details .    ASSESSMENT AND PLAN:  1. Chest Pain:  Resolved normal myovue in May 2016   Observe ECG normal today  2. HTN  Improved recent admission for azotemia ACe/Diuretic stopped  3. DM  Improved with endocrine f/u  4. COPD discussed smoking cessation  Would benefit from inhalers  F/U primary  She has cut back to less than 1/2 ppd since last visit  Care has been better since she has primary care and endocrine f/u with [facility]   Current medicines  are reviewed at length with the patient today.  The patient does not have concerns regarding medicines.  The following changes have been made:  None   Labs/ tests ordered today include:    Orders Placed This Encounter  Procedures  . EKG 12-Lead     Disposition:   FU with  Me 6 months     Signed, Charlton Haws, MD  10/28/2015 3:46 PM    [facility] [address] [place], [place], [place]  [address] Phone: [phone]; Fax: [phone]

## 2015-10-28 ENCOUNTER — Ambulatory Visit (INDEPENDENT_AMBULATORY_CARE_PROVIDER_SITE_OTHER): Payer: Self-pay | Admitting: Cardiovascular Disease

## 2015-10-28 ENCOUNTER — Encounter: Payer: Self-pay | Admitting: Cardiovascular Disease

## 2015-10-28 VITALS — BP 140/76 | HR 82 | Ht 69.5 in | Wt 205.8 lb

## 2015-10-28 DIAGNOSIS — I10 Essential (primary) hypertension: Secondary | ICD-10-CM

## 2015-10-28 NOTE — Patient Instructions (Signed)

## 2015-11-03 ENCOUNTER — Ambulatory Visit: Payer: Self-pay | Admitting: Gastroenterology

## 2015-11-09 ENCOUNTER — Ambulatory Visit: Payer: Self-pay | Admitting: Medical

## 2015-12-20 ENCOUNTER — Encounter (INDEPENDENT_AMBULATORY_CARE_PROVIDER_SITE_OTHER): Payer: Self-pay | Admitting: Internal Medicine

## 2015-12-20 DIAGNOSIS — Z0289 Encounter for other administrative examinations: Secondary | ICD-10-CM

## 2015-12-20 DIAGNOSIS — E1140 Type 2 diabetes mellitus with diabetic neuropathy, unspecified: Secondary | ICD-10-CM

## 2015-12-20 LAB — POCT GLYCOSYLATED HEMOGLOBIN (HGB A1C): Hemoglobin A1C: 6

## 2015-12-22 NOTE — Progress Notes (Signed)
This encounter was created in error - please disregard.
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
## 2017-06-18 ENCOUNTER — Telehealth: Payer: Self-pay

## 2017-06-18 NOTE — Telephone Encounter (Signed)
Left message for patient to call back. Patient needs an appointment to follow-up with Dr. Eden EmmsNishan. Received [facility]KG from [facility] for Dr. Eden EmmsNishan to review. Patient is due for a f/u appointment. Consulted Dayna Dunn PA, about changes to EKG from last EKG on 10/28/15. She recommend patient come in for visit. No other records received.

## 2017-06-18 NOTE — Telephone Encounter (Signed)
Patient stated she is going to be seeing a new cardiologist with her PCP practice in February. Informed patient to let our office know if there is anything we can do to help her transition. Also, informed her it would be helpful if she would have [facility] send EKG, etc. To her new cardiologist, so they can keep up to date. Patient verbalized understanding.

## 2021-09-19 ENCOUNTER — Telehealth: Payer: Self-pay

## 2021-09-19 NOTE — Telephone Encounter (Signed)
Spoke with patient's spouse [name] and scheduled a telephonic Palliative Consult for 09/26/21 @ 1:30 PM.  ? ?Consent obtained; updated Netsmart, Team List and Epic.  ? ? ?

## 2021-09-26 ENCOUNTER — Other Ambulatory Visit: Payer: Medicare Other | Admitting: Internal Medicine

## 2021-10-17 DEATH — deceased
